# Patient Record
Sex: Female | Born: 1980
Health system: Southern US, Community
[De-identification: ages and names within clinical notes are randomized; demographics above are authoritative.]

## PROBLEM LIST (undated history)

## (undated) DIAGNOSIS — J45909 Unspecified asthma, uncomplicated: Secondary | ICD-10-CM

## (undated) DIAGNOSIS — R87629 Unspecified abnormal cytological findings in specimens from vagina: Secondary | ICD-10-CM

## (undated) HISTORY — DX: Unspecified abnormal cytological findings in specimens from vagina: R87.629

---

## 2002-03-23 ENCOUNTER — Other Ambulatory Visit: Admission: RE | Admit: 2002-03-23 | Discharge: 2002-03-23 | Payer: Self-pay | Admitting: Obstetrics and Gynecology

## 2005-02-09 ENCOUNTER — Ambulatory Visit: Payer: Self-pay | Admitting: Family Medicine

## 2005-06-23 ENCOUNTER — Ambulatory Visit: Payer: Self-pay | Admitting: Sports Medicine

## 2007-06-01 ENCOUNTER — Emergency Department (HOSPITAL_COMMUNITY): Admission: EM | Admit: 2007-06-01 | Discharge: 2007-06-01 | Payer: Self-pay | Admitting: Emergency Medicine

## 2008-12-25 ENCOUNTER — Ambulatory Visit: Payer: Self-pay | Admitting: Sports Medicine

## 2008-12-25 DIAGNOSIS — R269 Unspecified abnormalities of gait and mobility: Secondary | ICD-10-CM | POA: Insufficient documentation

## 2008-12-25 DIAGNOSIS — M2010 Hallux valgus (acquired), unspecified foot: Secondary | ICD-10-CM | POA: Insufficient documentation

## 2008-12-25 DIAGNOSIS — M2140 Flat foot [pes planus] (acquired), unspecified foot: Secondary | ICD-10-CM | POA: Insufficient documentation

## 2009-07-31 ENCOUNTER — Ambulatory Visit: Payer: Self-pay | Admitting: Sports Medicine

## 2009-07-31 DIAGNOSIS — M79609 Pain in unspecified limb: Secondary | ICD-10-CM | POA: Insufficient documentation

## 2010-05-05 NOTE — Assessment & Plan Note (Signed)
Summary: ORTHOTICS/JW   Vital Signs:  Patient profile:   30 year old female Height:      63 inches Weight:      160 pounds BMI:     28.45 BP sitting:   100 / 60  Vitals Entered By: Lillia Pauls CMA (December 25, 2008 2:58 PM)  History of Present Illness: Received orthotics from outside facility 3 yrs ago. Notes excessive thickness of heel portion. Otherwise she is satisfied with her orthotics. Desires new orthotics with decreased heel thickness.  Physical Exam  General:  Well-developed,well-nourished,in no acute distress; alert,appropriate and cooperative throughout examination Msk:  Ankles/Feet: FROM. 5/5 strength. Normal neurovascular examination. Bilateral pes planus with excessive pronation which is corrected after insertion of orthotics which were molded to her desired comfort level. Normal PT function. No callous formation. Mild left hallux valgus.   Impression & Recommendations:  Problem # 1:  ABNORMALITY OF GAIT (ICD-781.2)  The patient was fitted for a standard, cushioned, size 6 orthotic. The orthotic was subsequently heated. The patient was placed on the orthotic blank positioned on the orthotic stand. The patient was positioned in subtalar neutral position in a weight bearing stance. After completion of molding, a stable blue EVA base was applied to the orthotic blank. The blank was ground to a stable position for weight bearing.  She will return to clinic as needed.  time spent 30 mins  Orders: [employer], each unit ([id number])  Problem # 2:  HALLUX VALGUS (ICD-735.0)  -Will monitor for now given asymptomatic state. -RTC as needed.  Orders: [employer], each unit [phone])  Problem # 3:  PES PLANUS (ICD-734)  Orders: [employer] Materials, each unit ([id number])  orthotics provid adequate correction she will need these permanently

## 2010-05-05 NOTE — Assessment & Plan Note (Signed)
Summary: ORTHOTICS ADJUSTMENT   Vital Signs:  Patient profile:   30 year old female Height:      63 inches Weight:      158 pounds BP sitting:   113 / 74  Vitals Entered By: Lillia Pauls CMA (July 31, 2009 1:41 PM)  History of Present Illness: Pt presents with left great to pain that has started since she has started doing more running in addition to a boot camp. She has had a similar pain before and had been diagnosed with sesamoiditis. Her last pair of orthotics had a a special sesamoid cushion that was not placed on her most recent pair of orthotics. She is interested in trying to get that padding placed again on her newest pair of orthotics. She has not needed any medications and it does not wake her from sleep. She is able to continue her normal activities but has noticed that the pain has become more persistent.   Physical Exam  General:  alert and well-developed.   Head:  normocephalic and atraumatic.   Neck:  supple.   Lungs:  normal respiratory effort.   Msk:  Left Foot: Transverse arch breakdown No bony abnormalities, edema or bruising Full ROM of her toes and ankles Pain along the plantar surface with forced big toe extension No significant tenderness to palpation over her medial and lateral sesamoids 5/5 strength with resisted ankle ROM testing Able to bear weight easily  Right Foot: Normal inspection Full ROM of her ankle and toes No TTP throughout 5/5 strength with resisted ankle ROM Able to bear weight easily   Impression & Recommendations:  Problem # 1:  FOOT PAIN (ICD-729.5) Assessment New Appears to have mild tendinitis of flexor hallicus longus vs. mild sesamoiditis 1. Sesamoid "L" cushion on her left orthotic to decrease impact over her flexor tendon. She felt comfortable with this prior to leaving the office. 2. May drop off her new orthotics with her old orthotics (which have the padding which has worked in the past) if the "L" shaped cushion does  not work. Consider a simple first ray pad as well. 3. Return as needed.   Problem # 2:  PES PLANUS (ICD-734)

## 2011-01-06 ENCOUNTER — Other Ambulatory Visit: Payer: Self-pay | Admitting: Sports Medicine

## 2011-01-06 DIAGNOSIS — M25512 Pain in left shoulder: Secondary | ICD-10-CM

## 2011-01-06 DIAGNOSIS — M25511 Pain in right shoulder: Secondary | ICD-10-CM

## 2011-01-10 ENCOUNTER — Other Ambulatory Visit: Payer: Self-pay

## 2011-01-12 ENCOUNTER — Other Ambulatory Visit: Payer: Self-pay

## 2011-02-08 ENCOUNTER — Ambulatory Visit
Admission: RE | Admit: 2011-02-08 | Discharge: 2011-02-08 | Disposition: A | Discharge status: Home / Self Care | Payer: BC Managed Care – PPO | Source: Ambulatory Visit | Attending: Sports Medicine | Admitting: Sports Medicine

## 2011-02-08 DIAGNOSIS — M25512 Pain in left shoulder: Secondary | ICD-10-CM

## 2012-04-05 NOTE — L&D Delivery Note (Signed)
Delivery Note At 4:33 AM a viable and healthy female was delivered via  (Presentation: LOA  ).  APGAR: 9,9 ; weight pending.   Placenta status: Intact, Spontaneous.  Cord:  with the following complications: [place]ane[place] x one reduced on perineum.  Cord pH: na  Anesthesia:  epidural Episiotomy: none Lacerations: second Suture Repair: 2.0 vicryl rapide Est. Blood Loss (mL): 200  Mom to postpartum.  Baby to nursery-stable.  Hades Mathew J 11/20/2012, 4:43 AM

## 2012-04-20 LAB — OB RESULTS CONSOLE HEPATITIS B SURFACE ANTIGEN: Hepatitis B Surface Ag: NEGATIVE

## 2012-04-20 LAB — OB RESULTS CONSOLE RUBELLA ANTIBODY, IGM: Rubella: IMMUNE

## 2012-04-20 LAB — OB RESULTS CONSOLE ABO/RH: RH Type: POSITIVE

## 2012-04-20 LAB — OB RESULTS CONSOLE HIV ANTIBODY (ROUTINE TESTING): HIV: NONREACTIVE

## 2012-04-20 LAB — OB RESULTS CONSOLE ANTIBODY SCREEN: Antibody Screen: NEGATIVE

## 2012-04-20 LAB — OB RESULTS CONSOLE RPR: RPR: NONREACTIVE

## 2012-05-01 LAB — OB RESULTS CONSOLE GC/CHLAMYDIA
Chlamydia: NEGATIVE
Gonorrhea: NEGATIVE

## 2012-10-12 LAB — OB RESULTS CONSOLE GBS: GBS: NEGATIVE

## 2012-11-19 ENCOUNTER — Inpatient Hospital Stay (HOSPITAL_COMMUNITY)
Admission: AD | Admit: 2012-11-19 | Discharge: 2012-11-21 | DRG: 373 | Disposition: A | Discharge status: Home / Self Care | Payer: BC Managed Care – PPO | Source: Ambulatory Visit | Attending: Obstetrics and Gynecology | Admitting: Obstetrics and Gynecology

## 2012-11-19 ENCOUNTER — Encounter (HOSPITAL_COMMUNITY): Payer: Self-pay

## 2012-11-19 DIAGNOSIS — M2010 Hallux valgus (acquired), unspecified foot: Secondary | ICD-10-CM

## 2012-11-19 DIAGNOSIS — M2140 Flat foot [pes planus] (acquired), unspecified foot: Secondary | ICD-10-CM

## 2012-11-19 DIAGNOSIS — M79609 Pain in unspecified limb: Secondary | ICD-10-CM

## 2012-11-19 DIAGNOSIS — R269 Unspecified abnormalities of gait and mobility: Secondary | ICD-10-CM

## 2012-11-19 HISTORY — DX: Unspecified asthma, uncomplicated: J45.909

## 2012-11-19 LAB — POCT FERN TEST: POCT Fern Test: POSITIVE

## 2012-11-19 MED ORDER — LACTATED RINGERS IV SOLN
INTRAVENOUS | Status: DC
  Administered 2012-11-19: 125 mL/h via INTRAVENOUS
  Administered 2012-11-20: 02:00:00 via INTRAVENOUS

## 2012-11-19 MED ORDER — IBUPROFEN 600 MG PO TABS: 600.0000 mg | ORAL_TABLET | Freq: Four times a day (QID) | ORAL | Status: DC | PRN

## 2012-11-19 MED ORDER — OXYTOCIN BOLUS FROM INFUSION
500.0000 mL | INTRAVENOUS | Status: DC
  Administered 2012-11-20: 500 mL via INTRAVENOUS

## 2012-11-19 MED ORDER — ONDANSETRON HCL 4 MG/2ML IJ SOLN: 4.0000 mg | Freq: Four times a day (QID) | INTRAMUSCULAR | Status: DC | PRN

## 2012-11-19 MED ORDER — OXYTOCIN 40 UNITS IN LACTATED RINGERS INFUSION - SIMPLE MED
62.5000 mL/h | INTRAVENOUS | Status: DC
  Administered 2012-11-20: 62.5 mL/h via INTRAVENOUS

## 2012-11-19 MED ORDER — LIDOCAINE HCL (PF) 1 % IJ SOLN
30.0000 mL | INTRAMUSCULAR | Status: DC | PRN
  Filled 2012-11-19 (×2): qty 30

## 2012-11-19 MED ORDER — FLEET ENEMA 7-19 GM/118ML RE ENEM: 1.0000 | ENEMA | RECTAL | Status: DC | PRN

## 2012-11-19 MED ORDER — CITRIC ACID-SODIUM CITRATE 334-500 MG/5ML PO SOLN: 30.0000 mL | ORAL | Status: DC | PRN

## 2012-11-19 MED ORDER — TERBUTALINE SULFATE 1 MG/ML IJ SOLN: 0.2500 mg | Freq: Once | INTRAMUSCULAR | Status: AC | PRN

## 2012-11-19 MED ORDER — OXYTOCIN 40 UNITS IN LACTATED RINGERS INFUSION - SIMPLE MED
1.0000 m[IU]/min | INTRAVENOUS | Status: DC
  Administered 2012-11-20: 2 m[IU]/min via INTRAVENOUS

## 2012-11-19 MED ORDER — OXYCODONE-ACETAMINOPHEN 5-325 MG PO TABS: 1.0000 | ORAL_TABLET | ORAL | Status: DC | PRN

## 2012-11-19 MED ORDER — ACETAMINOPHEN 325 MG PO TABS: 650.0000 mg | ORAL_TABLET | ORAL | Status: DC | PRN

## 2012-11-19 MED ORDER — LACTATED RINGERS IV SOLN: 500.0000 mL | INTRAVENOUS | Status: DC | PRN

## 2012-11-19 NOTE — MAU Note (Signed)
Pt states uc q 3-4 mins for several hours. States some LOF but not sure if water is broken. Denies vaginal bleeding. States good FM

## 2012-11-19 NOTE — Progress Notes (Signed)
Laura Bruce is a 32 y.o. G1P0 at [redacted]w[redacted]d by LMP admitted for active labor, rupture of membranes  Subjective: Chief Complaint  Patient presents with  . Rupture of Membranes    Objective: BP 111/81  Pulse 90  Temp(Src) 97.5 F (36.4 C) (Oral)  Resp 18  SpO2 99%      FHT:  FHR: 145 bpm, variability: moderate,  accelerations:  Present,  decelerations:  Absent UC:   irregular, every 4 minutes SVE:   1 cm dilated, 70 effaced, -1 station Clear AF Labs: No results found for this basename: WBC, HGB, HCT, MCV, PLT    Assessment / Plan: Spontaneous labor, progressing normally  Labor: Progressing normally Fetal Wellbeing:  Category I Pain Control:  Labor support without medications Anticipated MOD:  NSVD  Laura Bruce 11/19/2012, 11:36 PM

## 2012-11-19 NOTE — MAU Provider Note (Signed)
Laura Bruce is a 32 y.o. female presenting for ROM and labor. History OB History   Grav Para Term Preterm Abortions TAB SAB Ect Mult Living   1              History reviewed. No pertinent past medical history. History reviewed. No pertinent past surgical history. Family History: family history is not on file. Social History:  reports that she has never smoked. She does not have any smokeless tobacco history on file. She reports that she does not drink alcohol or use illicit drugs.   Prenatal Transfer Tool  Maternal Diabetes: No Genetic Screening: Normal Maternal Ultrasounds/Referrals: Normal Fetal Ultrasounds or other Referrals:  None Maternal Substance Abuse:  No Significant Maternal Medications:  None Significant Maternal Lab Results:  None Other Comments:  None  ROS  Dilation: 1 Effacement (%): 70 Station: -1 Exam by:: D Simpson RN Blood pressure 111/81, pulse 90, temperature 97.5 F (36.4 C), temperature source Oral, resp. rate 18, SpO2 99.00%. Exam Physical Exam  HEENT: nl Neck supple Lungs clear Cardio: RRR Abd : gravid , nt Ve above Neuro nonfocal  Prenatal labs:- see prenata ABO, Rh:   Antibody:   Rubella:   RPR:    HBsAg:    HIV:    GBS:     Assessment/Plan: Term IUP with SROM and early labor Pitocin , see orders   Lorel Lembo J 11/19/2012, 11:37 PM

## 2012-11-20 ENCOUNTER — Inpatient Hospital Stay (HOSPITAL_COMMUNITY): Payer: BC Managed Care – PPO | Admitting: Anesthesiology

## 2012-11-20 ENCOUNTER — Encounter (HOSPITAL_COMMUNITY): Payer: Self-pay | Admitting: *Deleted

## 2012-11-20 ENCOUNTER — Encounter (HOSPITAL_COMMUNITY): Payer: Self-pay | Admitting: Anesthesiology

## 2012-11-20 LAB — CBC
HCT: 33 % — ABNORMAL LOW (ref 36.0–46.0)
HCT: 36 % (ref 36.0–46.0)
Hemoglobin: 11.4 g/dL — ABNORMAL LOW (ref 12.0–15.0)
Hemoglobin: 12.5 g/dL (ref 12.0–15.0)
MCH: 29.6 pg (ref 26.0–34.0)
MCH: 29.6 pg (ref 26.0–34.0)
MCHC: 34.5 g/dL (ref 30.0–36.0)
MCHC: 34.7 g/dL (ref 30.0–36.0)
MCV: 85.3 fL (ref 78.0–100.0)
MCV: 85.7 fL (ref 78.0–100.0)
Platelets: 195 10*3/uL (ref 150–400)
Platelets: 229 10*3/uL (ref 150–400)
RBC: 3.85 MIL/uL — ABNORMAL LOW (ref 3.87–5.11)
RBC: 4.22 MIL/uL (ref 3.87–5.11)
RDW: 13.9 % (ref 11.5–15.5)
RDW: 13.9 % (ref 11.5–15.5)
WBC: 14 10*3/uL — ABNORMAL HIGH (ref 4.0–10.5)
WBC: 17.9 10*3/uL — ABNORMAL HIGH (ref 4.0–10.5)

## 2012-11-20 LAB — RPR: RPR Ser Ql: NONREACTIVE

## 2012-11-20 MED ORDER — PRENATAL MULTIVITAMIN CH
1.0000 | ORAL_TABLET | Freq: Every day | ORAL | Status: DC
  Administered 2012-11-20 – 2012-11-21 (×2): 1 via ORAL
  Filled 2012-11-20 (×2): qty 1

## 2012-11-20 MED ORDER — BENZOCAINE-MENTHOL 20-0.5 % EX AERO
1.0000 "application " | INHALATION_SPRAY | CUTANEOUS | Status: DC | PRN
  Administered 2012-11-20: 1 via TOPICAL
  Filled 2012-11-20: qty 56

## 2012-11-20 MED ORDER — SENNOSIDES-DOCUSATE SODIUM 8.6-50 MG PO TABS
2.0000 | ORAL_TABLET | Freq: Every day | ORAL | Status: DC
  Administered 2012-11-20: 2 via ORAL

## 2012-11-20 MED ORDER — METHYLERGONOVINE MALEATE 0.2 MG PO TABS: 0.2000 mg | ORAL_TABLET | ORAL | Status: DC | PRN

## 2012-11-20 MED ORDER — ONDANSETRON HCL 4 MG PO TABS: 4.0000 mg | ORAL_TABLET | ORAL | Status: DC | PRN

## 2012-11-20 MED ORDER — METHYLERGONOVINE MALEATE 0.2 MG/ML IJ SOLN: 0.2000 mg | INTRAMUSCULAR | Status: DC | PRN

## 2012-11-20 MED ORDER — LACTATED RINGERS IV SOLN
500.0000 mL | Freq: Once | INTRAVENOUS | Status: AC
  Administered 2012-11-20: 500 mL via INTRAVENOUS

## 2012-11-20 MED ORDER — FENTANYL 2.5 MCG/ML BUPIVACAINE 1/10 % EPIDURAL INFUSION (WH - ANES)
14.0000 mL/h | INTRAMUSCULAR | Status: DC | PRN
  Filled 2012-11-20: qty 125

## 2012-11-20 MED ORDER — WITCH HAZEL-GLYCERIN EX PADS: 1.0000 "application " | MEDICATED_PAD | CUTANEOUS | Status: DC | PRN

## 2012-11-20 MED ORDER — EPHEDRINE 5 MG/ML INJ
10.0000 mg | INTRAVENOUS | Status: DC | PRN
  Filled 2012-11-20: qty 2
  Filled 2012-11-20: qty 4

## 2012-11-20 MED ORDER — DIPHENHYDRAMINE HCL 50 MG/ML IJ SOLN: 12.5000 mg | INTRAMUSCULAR | Status: DC | PRN

## 2012-11-20 MED ORDER — ONDANSETRON HCL 4 MG/2ML IJ SOLN: 4.0000 mg | INTRAMUSCULAR | Status: DC | PRN

## 2012-11-20 MED ORDER — PHENYLEPHRINE 40 MCG/ML (10ML) SYRINGE FOR IV PUSH (FOR BLOOD PRESSURE SUPPORT)
80.0000 ug | PREFILLED_SYRINGE | INTRAVENOUS | Status: DC | PRN
  Filled 2012-11-20: qty 5
  Filled 2012-11-20: qty 2

## 2012-11-20 MED ORDER — DIBUCAINE 1 % RE OINT: 1.0000 "application " | TOPICAL_OINTMENT | RECTAL | Status: DC | PRN

## 2012-11-20 MED ORDER — LANOLIN HYDROUS EX OINT: TOPICAL_OINTMENT | CUTANEOUS | Status: DC | PRN

## 2012-11-20 MED ORDER — TETANUS-DIPHTH-ACELL PERTUSSIS 5-2.5-18.5 LF-MCG/0.5 IM SUSP: 0.5000 mL | Freq: Once | INTRAMUSCULAR | Status: DC

## 2012-11-20 MED ORDER — ZOLPIDEM TARTRATE 5 MG PO TABS: 5.0000 mg | ORAL_TABLET | Freq: Every evening | ORAL | Status: DC | PRN

## 2012-11-20 MED ORDER — EPHEDRINE 5 MG/ML INJ
10.0000 mg | INTRAVENOUS | Status: DC | PRN
  Filled 2012-11-20: qty 2

## 2012-11-20 MED ORDER — MONTELUKAST SODIUM 10 MG PO TABS
10.0000 mg | ORAL_TABLET | Freq: Every day | ORAL | Status: DC
  Administered 2012-11-20: 10 mg via ORAL
  Filled 2012-11-20 (×2): qty 1

## 2012-11-20 MED ORDER — OXYCODONE-ACETAMINOPHEN 5-325 MG PO TABS: 1.0000 | ORAL_TABLET | ORAL | Status: DC | PRN

## 2012-11-20 MED ORDER — DIPHENHYDRAMINE HCL 25 MG PO CAPS: 25.0000 mg | ORAL_CAPSULE | Freq: Four times a day (QID) | ORAL | Status: DC | PRN

## 2012-11-20 MED ORDER — SIMETHICONE 80 MG PO CHEW: 80.0000 mg | CHEWABLE_TABLET | ORAL | Status: DC | PRN

## 2012-11-20 MED ORDER — PHENYLEPHRINE 40 MCG/ML (10ML) SYRINGE FOR IV PUSH (FOR BLOOD PRESSURE SUPPORT)
80.0000 ug | PREFILLED_SYRINGE | INTRAVENOUS | Status: DC | PRN
  Filled 2012-11-20: qty 2

## 2012-11-20 MED ORDER — IBUPROFEN 600 MG PO TABS
600.0000 mg | ORAL_TABLET | Freq: Four times a day (QID) | ORAL | Status: DC
  Administered 2012-11-20 – 2012-11-21 (×6): 600 mg via ORAL
  Filled 2012-11-20 (×6): qty 1

## 2012-11-20 NOTE — Anesthesia Procedure Notes (Signed)
Epidural Patient location during procedure: OB Start time: 11/20/2012 12:20 AM End time: 11/20/2012 12:40 AM  Staffing Anesthesiologist: Lewie Loron R Performed by: anesthesiologist   Preanesthetic Checklist Completed: patient identified, pre-op evaluation, timeout performed, IV checked, risks and benefits discussed and monitors and equipment checked  Epidural Patient position: sitting Prep: site prepped and draped and DuraPrep Patient monitoring: heart rate, continuous pulse ox and blood pressure Approach: midline Injection technique: LOR air and LOR saline  Needle:  Needle type: Tuohy  Needle gauge: 17 G Needle length: 9 cm Needle insertion depth: 7 cm Catheter type: closed end flexible Catheter size: 19 Gauge Catheter at skin depth: 13 cm Test dose: negative  Assessment Sensory level: T8 Events: blood not aspirated, injection not painful, no injection resistance, negative IV test and no paresthesia  Additional Notes First attempt at L2/3, catheter threaded easily but intravascular. Second attmept without issue.Reason for block:procedure for pain

## 2012-11-20 NOTE — Anesthesia Preprocedure Evaluation (Signed)
Anesthesia Evaluation  Patient identified by MRN, date of birth, ID band Patient awake    Reviewed: Allergy & Precautions, H&P , NPO status , Patient's Chart, lab work & pertinent test results  Airway Mallampati: II TM Distance: >3 FB Neck ROM: Full    Dental  (+) Teeth Intact   Pulmonary asthma ,  breath sounds clear to auscultation        Cardiovascular negative cardio ROS  Rhythm:Regular Rate:Normal     Neuro/Psych negative neurological ROS  negative psych ROS   GI/Hepatic negative GI ROS, Neg liver ROS,   Endo/Other  negative endocrine ROS  Renal/GU negative Renal ROS     Musculoskeletal negative musculoskeletal ROS (+)   Abdominal   Peds  Hematology negative hematology ROS (+)   Anesthesia Other Findings   Reproductive/Obstetrics (+) Pregnancy                           Anesthesia Physical Anesthesia Plan  ASA: II  Anesthesia Plan: Epidural   Post-op Pain Management:    Induction:   Airway Management Planned:   Additional Equipment:   Intra-op Plan:   Post-operative Plan:   Informed Consent: I have reviewed the patients History and Physical, chart, labs and discussed the procedure including the risks, benefits and alternatives for the proposed anesthesia with the patient or authorized representative who has indicated his/her understanding and acceptance.     Plan Discussed with:   Anesthesia Plan Comments:         Anesthesia Quick Evaluation

## 2012-11-20 NOTE — Anesthesia Postprocedure Evaluation (Signed)
  Anesthesia Post-op Note  Patient: Laura Bruce  Procedure(s) Performed: * No procedures listed *  Patient Location: Mother/Baby  Anesthesia Type:Epidural  Level of Consciousness: awake  Airway and Oxygen Therapy: Patient Spontanous Breathing  Post-op Pain: none  Post-op Assessment: Patient's Cardiovascular Status Stable, Respiratory Function Stable, Patent Airway, No signs of Nausea or vomiting, Adequate PO intake, Pain level controlled, No headache, No backache, No residual numbness and No residual motor weakness  Post-op Vital Signs: Reviewed and stable  Complications: No apparent anesthesia complications

## 2012-11-21 MED ORDER — IBUPROFEN 600 MG PO TABS: 600.0000 mg | ORAL_TABLET | Freq: Four times a day (QID) | ORAL | Status: DC | PRN

## 2012-11-21 MED ORDER — DOCUSATE SODIUM 100 MG PO CAPS: 100.0000 mg | ORAL_CAPSULE | Freq: Two times a day (BID) | ORAL | Status: DC | PRN

## 2012-11-21 MED ORDER — OXYCODONE-ACETAMINOPHEN 5-325 MG PO TABS: 1.0000 | ORAL_TABLET | Freq: Four times a day (QID) | ORAL | Status: DC | PRN

## 2012-11-21 NOTE — Progress Notes (Signed)
Patient ID: Laura Bruce, female   DOB: July 24, 1980, 32 y.o.   MRN: [id number] PPD # 1  Subjective: Pt reports feeling well and eager for early d/c home/ Pain controlled with ibuprofen Tolerating po/ Voiding without problems/ No n/v Bleeding is light/ Newborn info:  Information for the patient's newborn:  Carmela, Piechowski [[id number]]  female Feeding: breast    Objective:  VS: Blood pressure 98/71, pulse 74, temperature 97.9 F (36.6 C), temperature source Oral, resp. rate 17.    Recent Labs  11/19/12 2344 11/20/12 0840  WBC 14.0* 17.9*  HGB 12.5 11.4*  HCT 36.0 33.0*  PLT 229 195    Blood type: B/Positive/-- (01/16 0000) Rubella: Immune (01/16 0000)    Physical Exam:  General:  alert, cooperative and no distress CV: Regular rate and rhythm Resp: clear Abdomen: soft, nontender, normal bowel sounds Uterine Fundus: firm, below umbilicus, nontender Perineum: healing with good reapproximation Lochia: minimal Ext: edema trace and Homans sign is negative, no sign of DVT    A/P: PPD # 2/ G1P1001/ S/P: SVD with 2nd deg repair Doing well and stable for discharge home RX: Ibuprofen 600mg  po Q 6 hrs prn pain #30 Refill x 1 Colace 100mg  po BID prn #60 ref x 1 Percocet 5/325 1 to 2 po Q 4 hrs prn pain #15 No refill WOB/GYN booklet given Routine pp visit in 6wks   Demetrius Revel, MSN, [facility] 11/21/2012, 10:25 AM

## 2012-11-21 NOTE — Discharge Summary (Signed)
Obstetric Discharge Summary Reason for Admission: G1 P0 @ 39w 4d with onset active labor and ROM Prenatal Procedures: NST and ultrasound Intrapartum Procedures: spontaneous vaginal delivery Postpartum Procedures: none Complications-Operative and Postpartum: 2nd degree perineal laceration Hemoglobin  Date Value Range Status  11/20/2012 11.4* 12.0 - 15.0 g/dL Final     HCT  Date Value Range Status  11/20/2012 33.0* 36.0 - 46.0 % Final    Physical Exam:  General: alert, cooperative and no distress Lochia: appropriate Uterine Fundus: firm Incision: n/a DVT Evaluation: No evidence of DVT seen on physical exam. Negative Homan's sign.  Discharge Diagnoses: G1 P1 s/p SVD with 2nd deg repair at 39wks  Discharge Information: Date: 11/21/2012 Activity: pelvic rest Diet: routine Medications: PNV, Ibuprofen, Colace and Percocet Condition: stable Instructions: refer to practice specific booklet Discharge to: home Follow-up Information   Follow up with Lenoard Aden, MD In 6 weeks.   Specialty:  Obstetrics and Gynecology   Contact information:   [address] [place] [address] [phone]       Newborn Data: Live born female on 11/20/12 Birth Weight: 7 lb 0.2 oz (3180 g) APGAR: 9, 9  Home with mother.  Detra Bores K 11/21/2012, 10:27 AM

## 2012-11-27 MED ORDER — FENTANYL 2.5 MCG/ML BUPIVACAINE 1/10 % EPIDURAL INFUSION (WH - ANES)
INTRAMUSCULAR | Status: DC | PRN
  Administered 2012-11-20: 14 mL/h via EPIDURAL

## 2013-02-27 ENCOUNTER — Encounter (INDEPENDENT_AMBULATORY_CARE_PROVIDER_SITE_OTHER): Payer: Self-pay | Admitting: Surgery

## 2013-02-27 ENCOUNTER — Ambulatory Visit (INDEPENDENT_AMBULATORY_CARE_PROVIDER_SITE_OTHER): Payer: BC Managed Care – PPO | Admitting: Surgery

## 2013-02-27 VITALS — BP 122/62 | HR 67 | Temp 98.0°F | Resp 18 | Ht 63.0 in | Wt 156.0 lb

## 2013-02-27 DIAGNOSIS — K8010 Calculus of gallbladder with chronic cholecystitis without obstruction: Secondary | ICD-10-CM

## 2013-02-27 NOTE — Patient Instructions (Signed)
  [facility]  LAPAROSCOPIC SURGERY - POST-OP INSTRUCTIONS  Always review your discharge instruction sheet given to you by the facility where your surgery was performed.  A prescription for pain medication may be given to you upon discharge.  Take your pain medication as prescribed.  If narcotic pain medicine is not needed, then you may take acetaminophen (Tylenol) or ibuprofen (Advil) as needed.  Take your usually prescribed medications unless otherwise directed.  If you need a refill on your pain medication, please contact your pharmacy.  They will contact our office to request authorization. Prescriptions will not be filled after 5 P.M. or on weekends.  You should follow a light diet the first few days after arrival home, such as soup and crackers or toast.  Be sure to include plenty of fluids daily.  Most patients will experience some swelling and bruising in the area of the incisions.  Ice packs will help.  Swelling and bruising can take several days to resolve.   It is common to experience some constipation if taking pain medication after surgery.  Increasing fluid intake and taking a stool softener (such as Colace) will usually help or prevent this problem from occurring.  A mild laxative (Milk of Magnesia or Miralax) should be taken according to package instructions if there are no bowel movements after 48 hours.  Unless discharge instructions indicate otherwise, you may remove your bandages 24-48 hours after surgery, and you may shower at that time.  You may have steri-strips (small skin tapes) in place directly over the incision.  These strips should be left on the skin for 7-10 days.  If your surgeon used skin glue on the incision, you may shower in 24 hours.  The glue will flake off over the next 2-3 weeks.  Any sutures or staples will be removed at the office during your follow-up visit.  ACTIVITIES:  You may resume regular (light) daily activities beginning the  next day-such as daily self-care, walking, climbing stairs-gradually increasing activities as tolerated.  You may have sexual intercourse when it is comfortable.  Refrain from any heavy lifting or straining until approved by your doctor.  You may drive when you are no longer taking prescription pain medication, you can comfortably wear a seatbelt, and you can safely maneuver your car and apply brakes.  You should see your doctor in the office for a follow-up appointment approximately 2-3 weeks after your surgery.  Make sure that you call for this appointment within a day or two after you arrive home to insure a convenient appointment time.  WHEN TO CALL YOUR DOCTOR: 1. Fever over 101.0 2. Inability to urinate 3. Continued bleeding from incision 4. Increased pain, redness, or drainage from the incision 5. Increasing abdominal pain  The clinic staff is available to answer your questions during regular business hours.  Please don't hesitate to call and ask to speak to one of the nurses for clinical concerns.  If you have a medical emergency, go to the nearest emergency room or call 911.  A surgeon from [facility] is always on call for the hospital.  Ayani Ospina M. Dayane Hillenburg, MD, FACS [facility] Office: [phone] Toll Free:  [phone] FAX [phone]  Web site: [email or web address] 

## 2013-02-27 NOTE — Progress Notes (Signed)
[facility]  Chief Complaint  Patient presents with  . New Evaluation    gallstones - referral from Dr. Elaine Griffin    HISTORY: Patient is a 32-year-old female referred by her primary care physician for management of symptomatic cholelithiasis. Patient has had a several month history of intermittent right upper quadrant and epigastric abdominal pain. In the past she has noted fatty food intolerance. She denies any history of jaundice or acholic stools. She denies fevers or chills. She has experienced nausea but no emesis. Patient does note that her mother underwent cholecystectomy for gallstones.  Ultrasound was performed. This showed multiple gallstones. There were no acute inflammatory changes. There was no biliary dilatation. Liver was normal.  Past Medical History  Diagnosis Date  . Asthma   . Postpartum care following vaginal delivery (8/18) 11/20/2012    Current Outpatient Prescriptions  Medication Sig Dispense Refill  . albuterol (PROVENTIL) (2.5 MG/3ML) 0.083% nebulizer solution Take 2.5 mg by nebulization every 6 (six) hours as needed for wheezing.      . cetirizine (ZYRTEC) 10 MG tablet Take 10 mg by mouth daily.      . DEBLITANE 0.35 MG tablet       . montelukast (SINGULAIR) 10 MG tablet Take 10 mg by mouth at bedtime.      . Prenatal Vit-Fe Fumarate-FA (MULTIVITAMIN-PRENATAL) 27-0.8 MG TABS tablet Take 1 tablet by mouth daily at 12 noon.      . therapeutic multivitamin-minerals (THERAGRAN-M) tablet Take 1 tablet by mouth daily.      . docusate sodium (COLACE) 100 MG capsule Take 1 capsule (100 mg total) by mouth 2 (two) times daily as needed for constipation.  60 capsule  1  . ibuprofen (ADVIL,MOTRIN) 600 MG tablet Take 1 tablet (600 mg total) by mouth every 6 (six) hours as needed for pain.  30 tablet  1  . oxyCODONE-acetaminophen (PERCOCET/ROXICET) 5-325 MG per tablet Take 1-2 tablets by mouth every 6 (six) hours as needed.  12 tablet   0   No current facility-administered medications for this visit.    No Known Allergies  Family History  Problem Relation Age of Onset  . Cancer Maternal Grandmother     breast    History   Social History  . Marital Status: Married    Spouse Name: N/A    Number of Children: N/A  . Years of Education: N/A   Social History Main Topics  . Smoking status: Never Smoker   . Smokeless tobacco: None  . Alcohol Use: No  . Drug Use: No  . Sexual Activity: Yes    Birth Control/ Protection: None   Other Topics Concern  . None   Social History Narrative  . None    REVIEW OF SYSTEMS - PERTINENT POSITIVES ONLY: Intermittent epigastric and right upper quadrant abdominal pain. Denies jaundice. Denies acholic stools. Denies fever. Denies chills. Occasional nausea. History of fatty food intolerance.  EXAM: Filed Vitals:   02/27/13 1640  BP: 122/62  Pulse: 67  Temp: 98 F (36.7 C)  Resp: 18    HEENT: normocephalic; pupils equal and reactive; sclerae clear; dentition good; mucous membranes moist NECK:  symmetric on extension; no palpable anterior or posterior cervical lymphadenopathy; no supraclavicular masses; no tenderness CHEST: clear to auscultation bilaterally without rales, rhonchi, or wheezes CARDIAC: regular rate and rhythm without significant murmur; peripheral pulses are full ABDOMEN: soft without distension; bowel sounds present; no mass; no hepatosplenomegaly; no hernia EXT:  non-tender   without edema; no deformity NEURO: no gross focal deficits; no sign of tremor   LABORATORY RESULTS: See [facility] (CHL-Epic) for most recent results  RADIOLOGY RESULTS: See [facility] (CHL-Epic) for most recent results  IMPRESSION: Symptomatic cholelithiasis, probable chronic cholecystitis  PLAN: Patient and I had a lengthy discussion regarding cholecystectomy. I provided her with written literature to review at home. We discussed laparoscopic cholecystectomy with  intraoperative cholangiography. We discussed the risk and benefits of the procedure. We discussed the potential for conversion to open Bruce. We discussed the hospital stay to be anticipated. We discussed the postoperative recovery and return to work and activities. She understands and wishes to proceed.  We will need to be conscious of the fact that she is 34 months postpartum and is breast feeding. We will bring this to the attention of the anesthesiologist.  The risks and benefits of the procedure have been discussed at length with the patient.  The patient understands the proposed procedure, potential alternative treatments, and the course of recovery to be expected.  All of the patient's questions have been answered at this time.  The patient wishes to proceed with Bruce.  Levia Waltermire M. Jacobe Study, MD, FACS [facility]  Primary Care Physician: GRIFFIN,ELAINE COLLINS, MD   

## 2013-03-09 ENCOUNTER — Encounter (HOSPITAL_COMMUNITY): Admission: RE | Payer: Self-pay | Source: Ambulatory Visit

## 2013-03-09 ENCOUNTER — Ambulatory Visit (HOSPITAL_COMMUNITY): Admission: RE | Admit: 2013-03-09 | Payer: BC Managed Care – PPO | Source: Ambulatory Visit | Admitting: Surgery

## 2013-03-09 SURGERY — LAPAROSCOPIC CHOLECYSTECTOMY WITH INTRAOPERATIVE CHOLANGIOGRAM
Anesthesia: General

## 2013-03-13 ENCOUNTER — Encounter (INDEPENDENT_AMBULATORY_CARE_PROVIDER_SITE_OTHER): Payer: Self-pay

## 2013-03-13 ENCOUNTER — Encounter (HOSPITAL_COMMUNITY): Payer: Self-pay | Admitting: Respiratory Therapy

## 2013-03-13 NOTE — Pre-Procedure Instructions (Signed)
CHARLOTTE FIDALGO  03/13/2013   Your procedure is scheduled on: Friday, December 12th   Report to [facility]  2 * 3 at 9:00 AM.             [facility] Parking is available)  Call this number if you have problems the morning of surgery: [phone]   Remember:   Do not eat food or drink liquids after midnight Thursday.   Take these medicines the morning of surgery with A SIP OF WATER: Cetirizine, Flonase.  Use Inhaler that morning.   Do not wear jewelry, make-up or nail polish.  Do not wear lotions, powders, or perfumes. You may NOT wear deodorant.  Do not shave underarms & legs 48 hours prior to surgery.    Do not bring valuables to the hospital.  [facility] is not responsible for any belongings or valuables.               Contacts, dentures or bridgework may not be worn into surgery.  Leave suitcase in the car. After surgery it may be brought to your room.  For patients admitted to the hospital, discharge time is determined by your treatment team.               Patients discharged the day of surgery will not be allowed to drive home, and a responsible person will need to stay with you for the first 24 hrs.   Name and phone number of your driver:    Special Instructions: Shower using CHG 2 nights before surgery and the night before surgery.  If you shower the day of surgery use CHG.  Use special wash - you have one bottle of CHG for all showers.  You should use approximately 1/3 of the bottle for each shower.   Please read over the following fact sheets that you were given: Pain Booklet and Surgical Site Infection Prevention

## 2013-03-14 ENCOUNTER — Encounter (HOSPITAL_COMMUNITY): Payer: Self-pay

## 2013-03-14 ENCOUNTER — Encounter (HOSPITAL_COMMUNITY)
Admission: RE | Admit: 2013-03-14 | Discharge: 2013-03-14 | Disposition: A | Discharge status: Home / Self Care | Payer: BC Managed Care – PPO | Source: Ambulatory Visit | Attending: Surgery | Admitting: Surgery

## 2013-03-14 LAB — CBC
HCT: 42.2 % (ref 36.0–46.0)
Hemoglobin: 14.5 g/dL (ref 12.0–15.0)
MCH: 30.5 pg (ref 26.0–34.0)
MCHC: 34.4 g/dL (ref 30.0–36.0)
MCV: 88.8 fL (ref 78.0–100.0)
Platelets: 301 10*3/uL (ref 150–400)
RBC: 4.75 MIL/uL (ref 3.87–5.11)
RDW: 12.7 % (ref 11.5–15.5)
WBC: 7.7 10*3/uL (ref 4.0–10.5)

## 2013-03-14 LAB — HCG, SERUM, QUALITATIVE: Preg, Serum: NEGATIVE

## 2013-03-15 ENCOUNTER — Telehealth (INDEPENDENT_AMBULATORY_CARE_PROVIDER_SITE_OTHER): Payer: Self-pay | Admitting: *Deleted

## 2013-03-15 MED ORDER — CEFAZOLIN SODIUM-DEXTROSE 2-3 GM-% IV SOLR
2.0000 g | INTRAVENOUS | Status: AC
  Administered 2013-03-16: 2 g via INTRAVENOUS
  Filled 2013-03-15: qty 50

## 2013-03-15 NOTE — Telephone Encounter (Signed)
Patient called to report that she was up all night last night with vomiting and diarrhea.  Patient is scheduled for lap chole tomorrow morning.  Patient is unsure if this is due to gallbladder or something else.  Explained that I would send a message to Dr. Gerrit Friends to make him aware and see if this is an issue for surgery tomorrow.

## 2013-03-15 NOTE — Telephone Encounter (Signed)
Pt called stating she is feeling much better and will show up for surgery in the morning. I advised pt this msg will be sent to Dr Gerrit Friends as an update.

## 2013-03-16 ENCOUNTER — Ambulatory Visit (HOSPITAL_COMMUNITY): Payer: BC Managed Care – PPO | Admitting: Certified Registered"

## 2013-03-16 ENCOUNTER — Encounter (HOSPITAL_COMMUNITY): Payer: BC Managed Care – PPO | Admitting: Certified Registered"

## 2013-03-16 ENCOUNTER — Encounter (HOSPITAL_COMMUNITY)
Admission: RE | Disposition: A | Discharge status: Home / Self Care | Payer: Self-pay | Source: Ambulatory Visit | Attending: Surgery

## 2013-03-16 ENCOUNTER — Observation Stay (HOSPITAL_COMMUNITY)
Admission: RE | Admit: 2013-03-16 | Discharge: 2013-03-16 | Disposition: A | Discharge status: Home / Self Care | Payer: BC Managed Care – PPO | Source: Ambulatory Visit | Attending: Surgery | Admitting: Surgery

## 2013-03-16 ENCOUNTER — Encounter (HOSPITAL_COMMUNITY): Payer: Self-pay | Admitting: *Deleted

## 2013-03-16 ENCOUNTER — Ambulatory Visit (HOSPITAL_COMMUNITY): Payer: BC Managed Care – PPO

## 2013-03-16 DIAGNOSIS — K8010 Calculus of gallbladder with chronic cholecystitis without obstruction: Secondary | ICD-10-CM

## 2013-03-16 DIAGNOSIS — K8020 Calculus of gallbladder without cholecystitis without obstruction: Secondary | ICD-10-CM | POA: Diagnosis present

## 2013-03-16 DIAGNOSIS — Z01812 Encounter for preprocedural laboratory examination: Secondary | ICD-10-CM | POA: Insufficient documentation

## 2013-03-16 DIAGNOSIS — K824 Cholesterolosis of gallbladder: Secondary | ICD-10-CM | POA: Insufficient documentation

## 2013-03-16 HISTORY — PX: CHOLECYSTECTOMY: SHX55

## 2013-03-16 SURGERY — LAPAROSCOPIC CHOLECYSTECTOMY WITH INTRAOPERATIVE CHOLANGIOGRAM
Anesthesia: General

## 2013-03-16 MED ORDER — PHENYLEPHRINE HCL 10 MG/ML IJ SOLN
INTRAMUSCULAR | Status: DC | PRN
  Administered 2013-03-16: 80 ug via INTRAVENOUS

## 2013-03-16 MED ORDER — PROPOFOL 10 MG/ML IV BOLUS
INTRAVENOUS | Status: DC | PRN
  Administered 2013-03-16: 50 mg via INTRAVENOUS

## 2013-03-16 MED ORDER — ALBUTEROL SULFATE (5 MG/ML) 0.5% IN NEBU: 2.5000 mg | INHALATION_SOLUTION | Freq: Four times a day (QID) | RESPIRATORY_TRACT | Status: DC | PRN

## 2013-03-16 MED ORDER — ONDANSETRON HCL 4 MG/2ML IJ SOLN
4.0000 mg | Freq: Four times a day (QID) | INTRAMUSCULAR | Status: AC | PRN
  Administered 2013-03-16: 4 mg via INTRAVENOUS

## 2013-03-16 MED ORDER — 0.9 % SODIUM CHLORIDE (POUR BTL) OPTIME
TOPICAL | Status: DC | PRN
  Administered 2013-03-16: 1000 mL

## 2013-03-16 MED ORDER — ONDANSETRON HCL 4 MG/2ML IJ SOLN
INTRAMUSCULAR | Status: DC | PRN
  Administered 2013-03-16: 4 mg via INTRAVENOUS

## 2013-03-16 MED ORDER — SODIUM CHLORIDE 0.9 % IV SOLN
INTRAVENOUS | Status: DC | PRN
  Administered 2013-03-16 (×2)

## 2013-03-16 MED ORDER — ACETAMINOPHEN 325 MG PO TABS: 650.0000 mg | ORAL_TABLET | ORAL | Status: DC | PRN

## 2013-03-16 MED ORDER — GLYCOPYRROLATE 0.2 MG/ML IJ SOLN
INTRAMUSCULAR | Status: DC | PRN
  Administered 2013-03-16: 0.6 mg via INTRAVENOUS

## 2013-03-16 MED ORDER — ROCURONIUM BROMIDE 100 MG/10ML IV SOLN
INTRAVENOUS | Status: DC | PRN
  Administered 2013-03-16: 40 mg via INTRAVENOUS

## 2013-03-16 MED ORDER — HYDROCODONE-ACETAMINOPHEN 5-325 MG PO TABS: 1.0000 | ORAL_TABLET | ORAL | Status: DC | PRN

## 2013-03-16 MED ORDER — KCL IN DEXTROSE-NACL 20-5-0.45 MEQ/L-%-% IV SOLN
INTRAVENOUS | Status: DC
  Administered 2013-03-16: 16:00:00 via INTRAVENOUS
  Filled 2013-03-16: qty 1000

## 2013-03-16 MED ORDER — HYDROMORPHONE HCL PF 1 MG/ML IJ SOLN
INTRAMUSCULAR | Status: AC
  Filled 2013-03-16: qty 1

## 2013-03-16 MED ORDER — HYDROCODONE-ACETAMINOPHEN 5-325 MG PO TABS
1.0000 | ORAL_TABLET | ORAL | Status: DC | PRN
  Administered 2013-03-16: 2 via ORAL
  Filled 2013-03-16: qty 2

## 2013-03-16 MED ORDER — ONDANSETRON HCL 4 MG/2ML IJ SOLN
INTRAMUSCULAR | Status: AC
  Administered 2013-03-16: 4 mg via INTRAVENOUS
  Filled 2013-03-16: qty 2

## 2013-03-16 MED ORDER — HYDROMORPHONE HCL PF 1 MG/ML IJ SOLN: 1.0000 mg | INTRAMUSCULAR | Status: DC | PRN

## 2013-03-16 MED ORDER — BUPIVACAINE-EPINEPHRINE (PF) 0.25% -1:200000 IJ SOLN
INTRAMUSCULAR | Status: AC
  Filled 2013-03-16: qty 30

## 2013-03-16 MED ORDER — NEOSTIGMINE METHYLSULFATE 1 MG/ML IJ SOLN
INTRAMUSCULAR | Status: DC | PRN
  Administered 2013-03-16: 4 mg via INTRAVENOUS

## 2013-03-16 MED ORDER — HYDROMORPHONE HCL PF 1 MG/ML IJ SOLN
INTRAMUSCULAR | Status: AC
  Administered 2013-03-16: 0.5 mg via INTRAVENOUS
  Filled 2013-03-16: qty 1

## 2013-03-16 MED ORDER — LIDOCAINE HCL (CARDIAC) 20 MG/ML IV SOLN
INTRAVENOUS | Status: DC | PRN
  Administered 2013-03-16: 50 mg via INTRAVENOUS

## 2013-03-16 MED ORDER — HYDROMORPHONE HCL PF 1 MG/ML IJ SOLN
0.2500 mg | INTRAMUSCULAR | Status: DC | PRN
  Administered 2013-03-16 (×4): 0.5 mg via INTRAVENOUS

## 2013-03-16 MED ORDER — MIDAZOLAM HCL 5 MG/5ML IJ SOLN
INTRAMUSCULAR | Status: DC | PRN
  Administered 2013-03-16: 2 mg via INTRAVENOUS

## 2013-03-16 MED ORDER — ONDANSETRON HCL 4 MG PO TABS: 4.0000 mg | ORAL_TABLET | Freq: Four times a day (QID) | ORAL | Status: DC | PRN

## 2013-03-16 MED ORDER — ONDANSETRON HCL 4 MG/2ML IJ SOLN: 4.0000 mg | Freq: Four times a day (QID) | INTRAMUSCULAR | Status: DC | PRN

## 2013-03-16 MED ORDER — FENTANYL CITRATE 0.05 MG/ML IJ SOLN
INTRAMUSCULAR | Status: DC | PRN
  Administered 2013-03-16: 50 ug via INTRAVENOUS
  Administered 2013-03-16: 100 ug via INTRAVENOUS
  Administered 2013-03-16 (×2): 50 ug via INTRAVENOUS

## 2013-03-16 MED ORDER — BUPIVACAINE-EPINEPHRINE 0.25% -1:200000 IJ SOLN
INTRAMUSCULAR | Status: DC | PRN
  Administered 2013-03-16: 20 mL

## 2013-03-16 MED ORDER — SODIUM CHLORIDE 0.9 % IR SOLN
Status: DC | PRN
  Administered 2013-03-16: 1000 mL

## 2013-03-16 MED ORDER — OXYCODONE HCL 5 MG/5ML PO SOLN
5.0000 mg | Freq: Once | ORAL | Status: DC | PRN
Start: 2013-03-16 — End: 2013-03-16

## 2013-03-16 MED ORDER — OXYCODONE HCL 5 MG PO TABS: 5.0000 mg | ORAL_TABLET | Freq: Once | ORAL | Status: DC | PRN

## 2013-03-16 MED ORDER — LACTATED RINGERS IV SOLN
INTRAVENOUS | Status: DC
  Administered 2013-03-16: 10:00:00 via INTRAVENOUS

## 2013-03-16 MED ORDER — LACTATED RINGERS IV SOLN
INTRAVENOUS | Status: DC | PRN
  Administered 2013-03-16 (×2): via INTRAVENOUS

## 2013-03-16 MED ORDER — CEFAZOLIN SODIUM-DEXTROSE 2-3 GM-% IV SOLR
INTRAVENOUS | Status: AC
  Filled 2013-03-16: qty 50

## 2013-03-16 SURGICAL SUPPLY — 43 items
APPLIER CLIP ROT 10 11.4 M/L (STAPLE) ×2
BENZOIN TINCTURE PRP APPL 2/3 (GAUZE/BANDAGES/DRESSINGS) ×2 IMPLANT
CANISTER SUCTION 2500CC (MISCELLANEOUS) ×2 IMPLANT
CHLORAPREP W/TINT 26ML (MISCELLANEOUS) ×2 IMPLANT
CLIP APPLIE ROT 10 11.4 M/L (STAPLE) ×1 IMPLANT
COVER MAYO STAND STRL (DRAPES) ×2 IMPLANT
COVER SURGICAL LIGHT HANDLE (MISCELLANEOUS) ×2 IMPLANT
DECANTER SPIKE VIAL GLASS SM (MISCELLANEOUS) IMPLANT
DRAPE C-ARM 42X72 X-RAY (DRAPES) ×2 IMPLANT
DRAPE UTILITY 15X26 W/TAPE STR (DRAPE) ×4 IMPLANT
ELECT REM PT RETURN 9FT ADLT (ELECTROSURGICAL) ×2
ELECTRODE REM PT RTRN 9FT ADLT (ELECTROSURGICAL) ×1 IMPLANT
GAUZE SPONGE 2X2 8PLY STRL LF (GAUZE/BANDAGES/DRESSINGS) ×1 IMPLANT
GLOVE BIO SURGEON STRL SZ7.5 (GLOVE) ×2 IMPLANT
GLOVE BIOGEL PI IND STRL 7.0 (GLOVE) ×3 IMPLANT
GLOVE BIOGEL PI IND STRL 7.5 (GLOVE) ×1 IMPLANT
GLOVE BIOGEL PI INDICATOR 7.0 (GLOVE) ×3
GLOVE BIOGEL PI INDICATOR 7.5 (GLOVE) ×1
GLOVE SURG ORTHO 8.0 STRL STRW (GLOVE) ×2 IMPLANT
GLOVE SURG SS PI 7.0 STRL IVOR (GLOVE) ×2 IMPLANT
GOWN STRL NON-REIN LRG LVL3 (GOWN DISPOSABLE) ×6 IMPLANT
GOWN STRL REIN XL XLG (GOWN DISPOSABLE) ×2 IMPLANT
KIT BASIN OR (CUSTOM PROCEDURE TRAY) ×2 IMPLANT
KIT ROOM TURNOVER OR (KITS) ×2 IMPLANT
NS IRRIG 1000ML POUR BTL (IV SOLUTION) ×2 IMPLANT
PAD ARMBOARD 7.5X6 YLW CONV (MISCELLANEOUS) ×4 IMPLANT
POUCH SPECIMEN RETRIEVAL 10MM (ENDOMECHANICALS) ×2 IMPLANT
SCISSORS LAP 5X35 DISP (ENDOMECHANICALS) ×2 IMPLANT
SET CHOLANGIOGRAPH 5 50 .035 (SET/KITS/TRAYS/PACK) ×2 IMPLANT
SET IRRIG TUBING LAPAROSCOPIC (IRRIGATION / IRRIGATOR) ×2 IMPLANT
SLEEVE ENDOPATH XCEL 5M (ENDOMECHANICALS) ×2 IMPLANT
SPECIMEN JAR SMALL (MISCELLANEOUS) ×2 IMPLANT
SPONGE GAUZE 2X2 STER 10/PKG (GAUZE/BANDAGES/DRESSINGS) ×1
STRIP CLOSURE SKIN 1/4X4 (GAUZE/BANDAGES/DRESSINGS) ×2 IMPLANT
SUT MNCRL AB 4-0 PS2 18 (SUTURE) ×2 IMPLANT
TAPE CLOTH SURG 6X10 WHT LF (GAUZE/BANDAGES/DRESSINGS) ×2 IMPLANT
TOWEL OR 17X24 6PK STRL BLUE (TOWEL DISPOSABLE) IMPLANT
TOWEL OR 17X26 10 PK STRL BLUE (TOWEL DISPOSABLE) ×2 IMPLANT
TRAY LAPAROSCOPIC (CUSTOM PROCEDURE TRAY) ×2 IMPLANT
TROCAR XCEL BLUNT TIP 100MML (ENDOMECHANICALS) ×2 IMPLANT
TROCAR XCEL NON-BLD 11X100MML (ENDOMECHANICALS) ×2 IMPLANT
TROCAR XCEL NON-BLD 5MMX100MML (ENDOMECHANICALS) ×2 IMPLANT
WATER STERILE IRR 1000ML POUR (IV SOLUTION) IMPLANT

## 2013-03-16 NOTE — Anesthesia Procedure Notes (Signed)
Procedure Name: Intubation Date/Time: 03/16/2013 11:30 AM Performed by: Brien Mates D Pre-anesthesia Checklist: Patient identified, Emergency Drugs available, Suction available, Timeout performed and Patient being monitored Patient Re-evaluated:Patient Re-evaluated prior to inductionOxygen Delivery Method: Circle system utilized Preoxygenation: Pre-oxygenation with 100% oxygen Intubation Type: IV induction Ventilation: Mask ventilation without difficulty Laryngoscope Size: Miller and 2 Grade View: Grade I Tube type: Oral Tube size: 7.5 mm Number of attempts: 1 Airway Equipment and Method: Stylet Placement Confirmation: ETT inserted through vocal cords under direct vision,  positive ETCO2 and breath sounds checked- equal and bilateral Secured at: 21 cm Tube secured with: Tape Dental Injury: Teeth and Oropharynx as per pre-operative assessment

## 2013-03-16 NOTE — Progress Notes (Signed)
Discharge instructions and prescriptions given to pt.  Pt. Verbalizes understanding.  IV discontinued.  Pt. Discharged to home with mother. Vanice Sarah

## 2013-03-16 NOTE — H&P (View-Only) (Signed)
[facility]  Chief Complaint  Patient presents with  . New Evaluation    gallstones - referral from Dr. Maurice Small    HISTORY: Patient is a 32 year old female referred by her primary care physician for management of symptomatic cholelithiasis. Patient has had a several month history of intermittent right upper quadrant and epigastric abdominal pain. In the past she has noted fatty food intolerance. She denies any history of jaundice or acholic stools. She denies fevers or chills. She has experienced nausea but no emesis. Patient does note that her mother underwent cholecystectomy for gallstones.  Ultrasound was performed. This showed multiple gallstones. There were no acute inflammatory changes. There was no biliary dilatation. Liver was normal.  Past Medical History  Diagnosis Date  . Asthma   . Postpartum care following vaginal delivery (8/18) 11/20/2012    Current Outpatient Prescriptions  Medication Sig Dispense Refill  . albuterol (PROVENTIL) (2.5 MG/3ML) 0.083% nebulizer solution Take 2.5 mg by nebulization every 6 (six) hours as needed for wheezing.      . cetirizine (ZYRTEC) 10 MG tablet Take 10 mg by mouth daily.      . DEBLITANE 0.35 MG tablet       . montelukast (SINGULAIR) 10 MG tablet Take 10 mg by mouth at bedtime.      . Prenatal Vit-Fe Fumarate-FA (MULTIVITAMIN-PRENATAL) 27-0.8 MG TABS tablet Take 1 tablet by mouth daily at 12 noon.      . therapeutic multivitamin-minerals (THERAGRAN-M) tablet Take 1 tablet by mouth daily.      Marland Kitchen docusate sodium (COLACE) 100 MG capsule Take 1 capsule (100 mg total) by mouth 2 (two) times daily as needed for constipation.  60 capsule  1  . ibuprofen (ADVIL,MOTRIN) 600 MG tablet Take 1 tablet (600 mg total) by mouth every 6 (six) hours as needed for pain.  30 tablet  1  . oxyCODONE-acetaminophen (PERCOCET/ROXICET) 5-325 MG per tablet Take 1-2 tablets by mouth every 6 (six) hours as needed.  12 tablet   0   No current facility-administered medications for this visit.    No Known Allergies  Family History  Problem Relation Age of Onset  . Cancer Maternal Grandmother     breast    History   Social History  . Marital Status: Married    Spouse Name: N/A    Number of Children: N/A  . Years of Education: N/A   Social History Main Topics  . Smoking status: Never Smoker   . Smokeless tobacco: None  . Alcohol Use: No  . Drug Use: No  . Sexual Activity: Yes    Birth Control/ Protection: None   Other Topics Concern  . None   Social History Narrative  . None    REVIEW OF SYSTEMS - PERTINENT POSITIVES ONLY: Intermittent epigastric and right upper quadrant abdominal pain. Denies jaundice. Denies acholic stools. Denies fever. Denies chills. Occasional nausea. History of fatty food intolerance.  EXAM: Filed Vitals:   02/27/13 1640  BP: 122/62  Pulse: 67  Temp: 98 F (36.7 C)  Resp: 18    HEENT: normocephalic; pupils equal and reactive; sclerae clear; dentition good; mucous membranes moist NECK:  symmetric on extension; no palpable anterior or posterior cervical lymphadenopathy; no supraclavicular masses; no tenderness CHEST: clear to auscultation bilaterally without rales, rhonchi, or wheezes CARDIAC: regular rate and rhythm without significant murmur; peripheral pulses are full ABDOMEN: soft without distension; bowel sounds present; no mass; no hepatosplenomegaly; no hernia EXT:  non-tender  without edema; no deformity NEURO: no gross focal deficits; no sign of tremor   LABORATORY RESULTS: See [facility] (CHL-Epic) for most recent results  RADIOLOGY RESULTS: See [facility] (CHL-Epic) for most recent results  IMPRESSION: Symptomatic cholelithiasis, probable chronic cholecystitis  PLAN: Patient and I had a lengthy discussion regarding cholecystectomy. I provided her with written literature to review at home. We discussed laparoscopic cholecystectomy with  intraoperative cholangiography. We discussed the risk and benefits of the procedure. We discussed the potential for conversion to open surgery. We discussed the hospital stay to be anticipated. We discussed the postoperative recovery and return to work and activities. She understands and wishes to proceed.  We will need to be conscious of the fact that she is 3 months postpartum and is breast feeding. We will bring this to the attention of the anesthesiologist.  The risks and benefits of the procedure have been discussed at length with the patient.  The patient understands the proposed procedure, potential alternative treatments, and the course of recovery to be expected.  All of the patient's questions have been answered at this time.  The patient wishes to proceed with surgery.  Velora Heckler, MD, FACS General & Endocrine Surgery [facility]  Primary Care Physician: Astrid Divine, MD

## 2013-03-16 NOTE — Interval H&P Note (Signed)
History and Physical Interval Note:  03/16/2013 10:33 AM  Laura Bruce  has presented today for surgery, with the diagnosis of gallstones, abdominal pain.  The various methods of treatment have been discussed with the patient and family. After consideration of risks, benefits and other options for treatment, the patient has consented to    Procedure(s): LAPAROSCOPIC CHOLECYSTECTOMY WITH INTRAOPERATIVE CHOLANGIOGRAM (N/A) as a surgical intervention .    The patient's history has been reviewed, patient examined, no change in status, stable for surgery.  I have reviewed the patient's chart and labs.  Questions were answered to the patient's satisfaction.    Velora Heckler, MD, [facility] Office: [phone]   [name]

## 2013-03-16 NOTE — Discharge Summary (Signed)
Physician Discharge Summary [facility]  Patient ID: Laura Bruce MRN: [id number] DOB/AGE: 34-May-1982 34 y.o.  Admit date: 03/16/2013 Discharge date: 03/16/2013  Admission Diagnoses:  Symptomatic cholelithiasis  Discharge Diagnoses:  Principal Problem:   Cholelithiasis with cholecystitis Active Problems:   Cholelithiasis   Discharged Condition: good  Hospital Course: Patient is a 32 year old female referred by her primary care physician for management of symptomatic cholelithiasis. Patient has had a several month history of intermittent right upper quadrant and epigastric abdominal pain. In the past she has noted fatty food intolerance. She denies any history of jaundice or acholic stools. She denies fevers or chills. She has experienced nausea but no emesis. Patient does note that her mother underwent cholecystectomy for gallstones. Ultrasound was performed. This showed multiple gallstones. There were no acute inflammatory changes. There was no biliary dilatation. Liver was normal.  Following cholecystectomy, patient admitted for observation.  Tolerated diet, ambulated, and voided.  Patient and family desired discharge home on day of surgery.  Consults: None  Significant Diagnostic Studies: none  Treatments: surgery: lap cholecystectomy with IOC  Discharge Exam: Blood pressure 107/66, pulse 104, temperature 98.2 F (36.8 C), temperature source Oral, resp. rate 17, SpO2 99.00%. HEENT - clear Neck - soft Chest - clear bilaterally Cor - RRR Abd - dressings dry and intact; soft without distension  Disposition: Home with family  Discharge Orders   Future Appointments Provider Department Dept Phone   04/02/2013 8:30 AM Velora Heckler, MD [facility], [place] [phone]   Future Orders Complete By Expires   Diet - low sodium heart healthy  As directed    Discharge instructions  As directed    Comments:     [facility]  LAPAROSCOPIC SURGERY - POST-OP INSTRUCTIONS  Always review your discharge instruction sheet given to you by the facility where your surgery was performed.  A prescription for pain medication may be given to you upon discharge.  Take your pain medication as prescribed.  If narcotic pain medicine is not needed, then you may take acetaminophen (Tylenol) or ibuprofen (Advil) as needed.  Take your usually prescribed medications unless otherwise directed.  If you need a refill on your pain medication, please contact your pharmacy.  They will contact our office to request authorization. Prescriptions will not be filled after 5 P.M. or on weekends.  You should follow a light diet the first few days after arrival home, such as soup and crackers or toast.  Be sure to include plenty of fluids daily.  Most patients will experience some swelling and bruising in the area of the incisions.  Ice packs will help.  Swelling and bruising can take several days to resolve.   It is common to experience some constipation if taking pain medication after surgery.  Increasing fluid intake and taking a stool softener (such as Colace) will usually help or prevent this problem from occurring.  A mild laxative (Milk of Magnesia or Miralax) should be taken according to package instructions if there are no bowel movements after 48 hours.  Unless discharge instructions indicate otherwise, you may remove your bandages 24-48 hours after surgery, and you may shower at that time.  You may have steri-strips (small skin tapes) in place directly over the incision.  These strips should be left on the skin for 7-10 days.  If your surgeon used skin glue on the incision, you may shower in 24 hours.  The glue will flake off over the  next 2-3 weeks.  Any sutures or staples will be removed at the office during your follow-up visit.  ACTIVITIES:  You may resume regular (light) daily activities beginning the next day-such as daily  self-care, walking, climbing stairs-gradually increasing activities as tolerated.  You may have sexual intercourse when it is comfortable.  Refrain from any heavy lifting or straining until approved by your doctor.  You may drive when you are no longer taking prescription pain medication, you can comfortably wear a seatbelt, and you can safely maneuver your car and apply brakes.  You should see your doctor in the office for a follow-up appointment approximately 2-3 weeks after your surgery.  Make sure that you call for this appointment within a day or two after you arrive home to insure a convenient appointment time.  WHEN TO CALL YOUR DOCTOR: Fever over 101.0 Inability to urinate Continued bleeding from incision Increased pain, redness, or drainage from the incision Increasing abdominal pain  The clinic staff is available to answer your questions during regular business hours.  Please don't hesitate to call and ask to speak to one of the nurses for clinical concerns.  If you have a medical emergency, go to the nearest emergency room or call 911.  A surgeon from [facility] is always on call for the hospital.  Velora Heckler, MD, [facility] Office: [phone] Toll Free:  [phone] FAX [phone]  Web site: [email or web address]   Increase activity slowly  As directed    Remove dressing in 48 hours  As directed        Medication List         albuterol (2.5 MG/3ML) 0.083% nebulizer solution  Commonly known as:  PROVENTIL  Take 2.5 mg by nebulization every 6 (six) hours as needed for wheezing.     calcium carbonate 1250 MG chewable tablet  Commonly known as:  OS-CAL  Chew 1 tablet by mouth daily.     cetirizine 10 MG tablet  Commonly known as:  ZYRTEC  Take 10 mg by mouth daily.     DEBLITANE 0.35 MG tablet  Generic drug:  norethindrone  Take 1 tablet by mouth daily.     fluticasone 50 MCG/ACT nasal spray  Commonly  known as:  FLONASE  Place 2 sprays into both nostrils daily.     HYDROcodone-acetaminophen 5-325 MG per tablet  Commonly known as:  NORCO/VICODIN  Take 1-2 tablets by mouth every 4 (four) hours as needed for moderate pain.     montelukast 10 MG tablet  Commonly known as:  SINGULAIR  Take 10 mg by mouth at bedtime.     multivitamin-prenatal 27-0.8 MG Tabs tablet  Take 1 tablet by mouth daily at 12 noon.     therapeutic multivitamin-minerals tablet  Take 1 tablet by mouth daily.           Follow-up Information   Follow up with Velora Heckler, MD. Schedule an appointment as soon as possible for a visit in 3 weeks.   Specialty:  General Surgery   Contact information:   [address] [place] [address] [phone]       Velora Heckler, MD, [facility] Office: [phone]   Signed: Velora Heckler 03/16/2013, 10:38 PM

## 2013-03-16 NOTE — Transfer of Care (Signed)
Immediate Anesthesia Transfer of Care Note  Patient: Laura Bruce  Procedure(s) Performed: Procedure(s): LAPAROSCOPIC CHOLECYSTECTOMY WITH INTRAOPERATIVE CHOLANGIOGRAM (N/A)  Patient Location: PACU  Anesthesia Type:General  Level of Consciousness: awake  Airway & Oxygen Therapy: Patient Spontanous Breathing  Post-op Assessment: Report given to PACU RN and Post -op Vital signs reviewed and stable  Post vital signs: Reviewed and stable  Complications: No apparent anesthesia complications

## 2013-03-16 NOTE — Op Note (Signed)
Procedure Note  Pre-operative Diagnosis:  Symptomatic cholelithiasis  Post-operative Diagnosis:  same  Surgeon:  Velora Heckler, MD, FACS  Assistant:  Magnus Ivan, RNFA   Procedure:  Laparoscopic cholecystectomy with intra-operative cholangiography  Anesthesia:  General  Estimated Blood Loss:  minimal  Drains: none         Specimen: Gallbladder to pathology  Indications:  Patient is a 32 year old female referred by her primary care physician for management of symptomatic cholelithiasis. Patient has had a several month history of intermittent right upper quadrant and epigastric abdominal pain. In the past she has noted fatty food intolerance. She denies any history of jaundice or acholic stools. She denies fevers or chills. She has experienced nausea but no emesis. Patient does note that her mother underwent cholecystectomy for gallstones. Ultrasound was performed. This showed multiple gallstones. There were no acute inflammatory changes. There was no biliary dilatation. Liver was normal.   Procedure Details:  The patient was seen in the pre-op holding area. The risks, benefits, complications, treatment options, and expected outcomes have been discussed with the patient. The patient agreed with the proposed plan and signed the informed consent form.  The patient was taken to Operating Room, identified as Laura Bruce and the procedure verified as Laparoscopic Cholecystectomy with Intraoperative Cholangiogram. A "time out" was completed and the above information confirmed.  Following induction of general anesthesia, the patient was placed in the supine position. The abdomen was prepped and draped in the usual aseptic fashion.  An incision was made in the skin below the umbilicus. The midline fascia was incised and the peritoneal cavity entered and the Hasson canula was introduced under direct vision.  The Hasson canula was secured with a 0-Vicryl pursestring suture. Pneumoperitoneum  was established with carbon dioxide. Additional trocars were introduced under direct vision along the right costal margin in the midline, mid-clavicular line, and anterior axillary line.   The gallbladder was identified and the fundus grasped and retracted cephalad. Adhesions were taken down bluntly and the electrocautery was utilized as needed, taking care not to injure any adjacent structures. The infundibulum was grasped and retracted laterally, exposing the peritoneum overlying the triangle of Calot. The peritoneum was incised and structures exposed with blunt dissection. The cystic duct was clearly identified, bluntly dissected circumferentially, and clipped at the neck of the gallbladder.  An incision was made in the cystic duct and the cholangiogram catheter introduced. The catheter was secured using an ligaclip.  Real-time cholangiography was performed using C-arm fluoroscopy.  There was rapid filling of a normal caliber common bile duct.  There was reflux of contrast into the left and right hepatic ductal systems.  There was free flow distally into the duodenum without filling defect or obstruction.  Catheter was removed from the peritoneal cavity.  The cystic duct was then triply ligated with surgical clips and divided. The cystic artery was identified, dissected circumferentially, ligated with ligaclips, and divided.  The gallbladder was dissected away from the liver bed using the electrocautery for hemostasis. The gallbladder was completely removed from the liver and placed into an endocatch bag. The right upper quadrant was irrigated and the gallbladder bed was inspected. Hemostasis was achieved with the electrocautery. Warm saline irrigation was utilized until clear.  Pneumoperitoneum was released after viewing removal of the trocars with good hemostasis noted. The umbilical wound was irrigated and the fascia was then closed with the pursestring suture.  Local anesthetic was infiltrated at  all port sites. The skin incisions were  closed with 4-0 Monocril subcuticular sutures and steri-strips and dressings were applied.  Instrument, sponge, and needle counts were correct at the conclusion of the case.  The patient was awakened from anesthesia and brought to the recovery room in stable condition.  The patient tolerated the procedure well.   Velora Heckler, MD, [facility] Office: [phone]

## 2013-03-16 NOTE — Anesthesia Postprocedure Evaluation (Signed)
Anesthesia Post Note  Patient: Laura Bruce  Procedure(s) Performed: Procedure(s) (LRB): LAPAROSCOPIC CHOLECYSTECTOMY WITH INTRAOPERATIVE CHOLANGIOGRAM (N/A)  Anesthesia type: General  Patient location: PACU  Post pain: Pain level controlled and Adequate analgesia  Post assessment: Post-op Vital signs reviewed, Patient's Cardiovascular Status Stable, Respiratory Function Stable, Patent Airway and Pain level controlled  Last Vitals:  Filed Vitals:   03/16/13 1315  BP: 115/78  Pulse: 65  Temp:   Resp: 16    Post vital signs: Reviewed and stable  Level of consciousness: awake, alert  and oriented  Complications: No apparent anesthesia complications

## 2013-03-16 NOTE — Anesthesia Preprocedure Evaluation (Addendum)
Anesthesia Evaluation  Patient identified by MRN, date of birth, ID band Patient awake    Reviewed: Allergy & Precautions, H&P , NPO status , Patient's Chart, lab work & pertinent test results  Airway Mallampati: II  Neck ROM: full    Dental  (+) Teeth Intact and Dental Advisory Given   Pulmonary asthma ,          Cardiovascular negative cardio ROS      Neuro/Psych    GI/Hepatic   Endo/Other    Renal/GU      Musculoskeletal   Abdominal   Peds  Hematology   Anesthesia Other Findings   Reproductive/Obstetrics                          Anesthesia Physical Anesthesia Plan  ASA: II  Anesthesia Plan: General   Post-op Pain Management:    Induction: Intravenous  Airway Management Planned: Oral ETT  Additional Equipment:   Intra-op Plan:   Post-operative Plan: Extubation in OR  Informed Consent: I have reviewed the patients History and Physical, chart, labs and discussed the procedure including the risks, benefits and alternatives for the proposed anesthesia with the patient or authorized representative who has indicated his/her understanding and acceptance.   Dental advisory given  Plan Discussed with: CRNA, Anesthesiologist and Surgeon  Anesthesia Plan Comments:        Anesthesia Quick Evaluation

## 2013-03-20 ENCOUNTER — Encounter (HOSPITAL_COMMUNITY): Payer: Self-pay | Admitting: Surgery

## 2013-04-02 ENCOUNTER — Ambulatory Visit (INDEPENDENT_AMBULATORY_CARE_PROVIDER_SITE_OTHER): Payer: BC Managed Care – PPO | Admitting: Surgery

## 2013-04-02 ENCOUNTER — Encounter (INDEPENDENT_AMBULATORY_CARE_PROVIDER_SITE_OTHER): Payer: Self-pay

## 2013-04-02 ENCOUNTER — Encounter (INDEPENDENT_AMBULATORY_CARE_PROVIDER_SITE_OTHER): Payer: Self-pay | Admitting: Surgery

## 2013-04-02 VITALS — BP 116/68 | HR 72 | Temp 98.2°F | Resp 14 | Ht 63.0 in | Wt 147.4 lb

## 2013-04-02 DIAGNOSIS — K8010 Calculus of gallbladder with chronic cholecystitis without obstruction: Secondary | ICD-10-CM

## 2013-04-02 NOTE — Progress Notes (Signed)
General Surgery [facility]  Chief Complaint  Patient presents with  . Routine Post Op    lap cholecystectomy 03/16/2013    HISTORY: Patient is a 32 year old female who underwent laparoscopic cholecystectomy on 03/16/2013. Postoperative course was uneventful. Final pathology shows chronic cholecystitis and cholelithiasis.  EXAM: Abdomen is soft, nontender, without distention. Surgical wounds are well healed. No sign of herniation. Right upper quadrant is soft without palpable mass.  IMPRESSION: Status post laparoscopic cholecystectomy for chronic cholecystitis and cholelithiasis  PLAN: The patient will begin applying topical creams to her incisions. She is released to full activity without restriction. Diet is unrestricted.  Patient will return for surgical care as needed.  Velora Heckler, MD, FACS General & Endocrine Surgery [facility]   Visit Diagnoses: 1. Cholelithiasis with cholecystitis

## 2013-04-02 NOTE — Patient Instructions (Signed)
  COCOA BUTTER & VITAMIN E CREAM  (Palmer's or other brand)  Apply cocoa butter/vitamin E cream to your incision 2 - 3 times daily.  Massage cream into incision for one minute with each application.  Use sunscreen (50 SPF or higher) for first 6 months after surgery if area is exposed to sun.  You may substitute Mederma or other scar reducing creams as desired.   

## 2013-04-09 ENCOUNTER — Encounter (INDEPENDENT_AMBULATORY_CARE_PROVIDER_SITE_OTHER): Payer: BC Managed Care – PPO | Admitting: Surgery

## 2013-10-09 LAB — OB RESULTS CONSOLE HIV ANTIBODY (ROUTINE TESTING): HIV: NONREACTIVE

## 2013-10-09 LAB — OB RESULTS CONSOLE ABO/RH: RH Type: POSITIVE

## 2013-10-09 LAB — OB RESULTS CONSOLE ANTIBODY SCREEN: Antibody Screen: NEGATIVE

## 2013-10-09 LAB — OB RESULTS CONSOLE GC/CHLAMYDIA
Chlamydia: NEGATIVE
Gonorrhea: NEGATIVE

## 2013-10-09 LAB — OB RESULTS CONSOLE RPR: RPR: NONREACTIVE

## 2013-10-09 LAB — OB RESULTS CONSOLE HEPATITIS B SURFACE ANTIGEN: Hepatitis B Surface Ag: NEGATIVE

## 2013-10-09 LAB — OB RESULTS CONSOLE RUBELLA ANTIBODY, IGM: Rubella: UNDETERMINED

## 2014-02-04 ENCOUNTER — Encounter (INDEPENDENT_AMBULATORY_CARE_PROVIDER_SITE_OTHER): Payer: Self-pay | Admitting: Surgery

## 2014-04-05 NOTE — L&D Delivery Note (Signed)
Delivery Note At 3:26 PM a viable and healthy female was delivered via  (Presentation: LOA  ).  APGAR: 9, 9; weight  pending.   Placenta status: spontaneous, intact.  Cord:  with the following complications: none.  Cord pH: na  Anesthesia:  epidural Episiotomy:  none Lacerations:  none Suture Repair: na Est. Blood Loss (mL):  200  Mom to postpartum.  Baby to Couplet care / Skin to Skin.  Kapena Hamme J 05/26/2014, 3:35 PM

## 2014-04-30 LAB — OB RESULTS CONSOLE GBS: GBS: NEGATIVE

## 2014-05-26 ENCOUNTER — Encounter (HOSPITAL_COMMUNITY): Payer: Self-pay | Admitting: *Deleted

## 2014-05-26 ENCOUNTER — Inpatient Hospital Stay (HOSPITAL_COMMUNITY): Payer: BLUE CROSS/BLUE SHIELD | Admitting: Anesthesiology

## 2014-05-26 ENCOUNTER — Inpatient Hospital Stay (HOSPITAL_COMMUNITY)
Admission: AD | Admit: 2014-05-26 | Discharge: 2014-05-27 | DRG: 775 | Disposition: A | Discharge status: Home / Self Care | Payer: BLUE CROSS/BLUE SHIELD | Source: Ambulatory Visit | Attending: Obstetrics and Gynecology | Admitting: Obstetrics and Gynecology

## 2014-05-26 DIAGNOSIS — O9989 Other specified diseases and conditions complicating pregnancy, childbirth and the puerperium: Secondary | ICD-10-CM

## 2014-05-26 DIAGNOSIS — D649 Anemia, unspecified: Secondary | ICD-10-CM | POA: Diagnosis not present

## 2014-05-26 DIAGNOSIS — O480 Post-term pregnancy: Secondary | ICD-10-CM | POA: Diagnosis present

## 2014-05-26 DIAGNOSIS — Z3A40 40 weeks gestation of pregnancy: Secondary | ICD-10-CM | POA: Diagnosis present

## 2014-05-26 DIAGNOSIS — O09523 Supervision of elderly multigravida, third trimester: Secondary | ICD-10-CM | POA: Diagnosis not present

## 2014-05-26 DIAGNOSIS — Z283 Underimmunization status: Secondary | ICD-10-CM

## 2014-05-26 DIAGNOSIS — Z3483 Encounter for supervision of other normal pregnancy, third trimester: Secondary | ICD-10-CM | POA: Diagnosis present

## 2014-05-26 DIAGNOSIS — Z2839 Other underimmunization status: Secondary | ICD-10-CM

## 2014-05-26 DIAGNOSIS — O99892 Other specified diseases and conditions complicating childbirth: Secondary | ICD-10-CM

## 2014-05-26 DIAGNOSIS — O9902 Anemia complicating childbirth: Secondary | ICD-10-CM | POA: Diagnosis present

## 2014-05-26 LAB — CBC
HCT: 34.7 % — ABNORMAL LOW (ref 36.0–46.0)
Hemoglobin: 11.5 g/dL — ABNORMAL LOW (ref 12.0–15.0)
MCH: 27 pg (ref 26.0–34.0)
MCHC: 33.1 g/dL (ref 30.0–36.0)
MCV: 81.5 fL (ref 78.0–100.0)
Platelets: 201 10*3/uL (ref 150–400)
RBC: 4.26 MIL/uL (ref 3.87–5.11)
RDW: 14.2 % (ref 11.5–15.5)
WBC: 9.5 10*3/uL (ref 4.0–10.5)

## 2014-05-26 LAB — POCT FERN TEST: POCT Fern Test: POSITIVE

## 2014-05-26 MED ORDER — ONDANSETRON HCL 4 MG PO TABS: 4.0000 mg | ORAL_TABLET | ORAL | Status: DC | PRN

## 2014-05-26 MED ORDER — LIDOCAINE HCL (PF) 1 % IJ SOLN
30.0000 mL | INTRAMUSCULAR | Status: DC | PRN
  Filled 2014-05-26: qty 30

## 2014-05-26 MED ORDER — OXYTOCIN 40 UNITS IN LACTATED RINGERS INFUSION - SIMPLE MED: 62.5000 mL/h | INTRAVENOUS | Status: DC

## 2014-05-26 MED ORDER — TERBUTALINE SULFATE 1 MG/ML IJ SOLN
0.2500 mg | Freq: Once | INTRAMUSCULAR | Status: DC | PRN
  Filled 2014-05-26: qty 1

## 2014-05-26 MED ORDER — OXYCODONE-ACETAMINOPHEN 5-325 MG PO TABS: 2.0000 | ORAL_TABLET | ORAL | Status: DC | PRN

## 2014-05-26 MED ORDER — PHENYLEPHRINE 40 MCG/ML (10ML) SYRINGE FOR IV PUSH (FOR BLOOD PRESSURE SUPPORT)
80.0000 ug | PREFILLED_SYRINGE | INTRAVENOUS | Status: DC | PRN
  Filled 2014-05-26: qty 2
  Filled 2014-05-26: qty 20

## 2014-05-26 MED ORDER — METHYLERGONOVINE MALEATE 0.2 MG/ML IJ SOLN: 0.2000 mg | INTRAMUSCULAR | Status: DC | PRN

## 2014-05-26 MED ORDER — LACTATED RINGERS IV SOLN
INTRAVENOUS | Status: DC
  Administered 2014-05-26 (×2): 1000 mL via INTRAVENOUS

## 2014-05-26 MED ORDER — EPHEDRINE 5 MG/ML INJ
10.0000 mg | INTRAVENOUS | Status: DC | PRN
  Filled 2014-05-26: qty 2

## 2014-05-26 MED ORDER — ZOLPIDEM TARTRATE 5 MG PO TABS: 5.0000 mg | ORAL_TABLET | Freq: Every evening | ORAL | Status: DC | PRN

## 2014-05-26 MED ORDER — LIDOCAINE HCL (PF) 1 % IJ SOLN
INTRAMUSCULAR | Status: DC | PRN
  Administered 2014-05-26: 5 mL
  Administered 2014-05-26: 3 mL
  Administered 2014-05-26: 5 mL

## 2014-05-26 MED ORDER — ONDANSETRON HCL 4 MG/2ML IJ SOLN: 4.0000 mg | Freq: Four times a day (QID) | INTRAMUSCULAR | Status: DC | PRN

## 2014-05-26 MED ORDER — ACETAMINOPHEN 325 MG PO TABS: 650.0000 mg | ORAL_TABLET | ORAL | Status: DC | PRN

## 2014-05-26 MED ORDER — WITCH HAZEL-GLYCERIN EX PADS: 1.0000 "application " | MEDICATED_PAD | CUTANEOUS | Status: DC | PRN

## 2014-05-26 MED ORDER — SIMETHICONE 80 MG PO CHEW: 80.0000 mg | CHEWABLE_TABLET | ORAL | Status: DC | PRN

## 2014-05-26 MED ORDER — DIBUCAINE 1 % RE OINT: 1.0000 "application " | TOPICAL_OINTMENT | RECTAL | Status: DC | PRN

## 2014-05-26 MED ORDER — LACTATED RINGERS IV SOLN: 500.0000 mL | INTRAVENOUS | Status: DC | PRN

## 2014-05-26 MED ORDER — IBUPROFEN 600 MG PO TABS
600.0000 mg | ORAL_TABLET | Freq: Four times a day (QID) | ORAL | Status: DC
  Administered 2014-05-26 – 2014-05-27 (×4): 600 mg via ORAL
  Filled 2014-05-26 (×4): qty 1

## 2014-05-26 MED ORDER — FENTANYL 2.5 MCG/ML BUPIVACAINE 1/10 % EPIDURAL INFUSION (WH - ANES)
14.0000 mL/h | INTRAMUSCULAR | Status: DC | PRN
  Administered 2014-05-26: 14 mL/h via EPIDURAL
  Filled 2014-05-26: qty 125

## 2014-05-26 MED ORDER — FENTANYL 2.5 MCG/ML BUPIVACAINE 1/10 % EPIDURAL INFUSION (WH - ANES)
INTRAMUSCULAR | Status: DC | PRN
  Administered 2014-05-26: 14 mL/h via EPIDURAL

## 2014-05-26 MED ORDER — LACTATED RINGERS IV SOLN: 500.0000 mL | Freq: Once | INTRAVENOUS | Status: DC

## 2014-05-26 MED ORDER — OXYTOCIN 40 UNITS IN LACTATED RINGERS INFUSION - SIMPLE MED
1.0000 m[IU]/min | INTRAVENOUS | Status: DC
  Administered 2014-05-26: 2 m[IU]/min via INTRAVENOUS
  Filled 2014-05-26: qty 1000

## 2014-05-26 MED ORDER — TETANUS-DIPHTH-ACELL PERTUSSIS 5-2.5-18.5 LF-MCG/0.5 IM SUSP: 0.5000 mL | Freq: Once | INTRAMUSCULAR | Status: DC

## 2014-05-26 MED ORDER — OXYCODONE-ACETAMINOPHEN 5-325 MG PO TABS: 1.0000 | ORAL_TABLET | ORAL | Status: DC | PRN

## 2014-05-26 MED ORDER — CITRIC ACID-SODIUM CITRATE 334-500 MG/5ML PO SOLN: 30.0000 mL | ORAL | Status: DC | PRN

## 2014-05-26 MED ORDER — METHYLERGONOVINE MALEATE 0.2 MG PO TABS: 0.2000 mg | ORAL_TABLET | ORAL | Status: DC | PRN

## 2014-05-26 MED ORDER — OXYTOCIN BOLUS FROM INFUSION: 500.0000 mL | INTRAVENOUS | Status: DC

## 2014-05-26 MED ORDER — ONDANSETRON HCL 4 MG/2ML IJ SOLN: 4.0000 mg | INTRAMUSCULAR | Status: DC | PRN

## 2014-05-26 MED ORDER — BENZOCAINE-MENTHOL 20-0.5 % EX AERO: 1.0000 "application " | INHALATION_SPRAY | CUTANEOUS | Status: DC | PRN

## 2014-05-26 MED ORDER — SENNOSIDES-DOCUSATE SODIUM 8.6-50 MG PO TABS
2.0000 | ORAL_TABLET | ORAL | Status: DC
  Administered 2014-05-26: 2 via ORAL
  Filled 2014-05-26: qty 2

## 2014-05-26 MED ORDER — PHENYLEPHRINE 40 MCG/ML (10ML) SYRINGE FOR IV PUSH (FOR BLOOD PRESSURE SUPPORT)
80.0000 ug | PREFILLED_SYRINGE | INTRAVENOUS | Status: DC | PRN
  Administered 2014-05-26: 40 ug via INTRAVENOUS
  Administered 2014-05-26: 80 ug via INTRAVENOUS
  Filled 2014-05-26: qty 2

## 2014-05-26 MED ORDER — PRENATAL MULTIVITAMIN CH
1.0000 | ORAL_TABLET | Freq: Every day | ORAL | Status: DC
  Administered 2014-05-27: 1 via ORAL
  Filled 2014-05-26: qty 1

## 2014-05-26 MED ORDER — DIPHENHYDRAMINE HCL 50 MG/ML IJ SOLN: 12.5000 mg | INTRAMUSCULAR | Status: DC | PRN

## 2014-05-26 MED ORDER — LANOLIN HYDROUS EX OINT: TOPICAL_OINTMENT | CUTANEOUS | Status: DC | PRN

## 2014-05-26 MED ORDER — DIPHENHYDRAMINE HCL 25 MG PO CAPS: 25.0000 mg | ORAL_CAPSULE | Freq: Four times a day (QID) | ORAL | Status: DC | PRN

## 2014-05-26 NOTE — H&P (Signed)
OB ADMISSION/ HISTORY & PHYSICAL:  Admission Date: 05/26/2014  8:39 AM  Admit Diagnosis: 40 weeks Normal Labor, SROM at home - clear fluid  Laura Bruce is a 34 y.o. female presenting for normal active labor and SROM @ 0300 clear fluid.  Prenatal History: G2P1001   EDC : 05/26/2014, Date entered prior to episode creation  Prenatal care at [facility]  Primary Ob Provider: Dr. Ronita Hipps Prenatal course complicated by none  Prenatal Labs: ABO, Rh:  B Positive 10/09/13 Antibody:  Negative 10/09/13 Rubella:   Indeterminate 10/09/13 RPR:   NR 02/27/14 HBsAg:   NR 10/09/13 HIV:   NR 10/09/13 GTT: 121 02/27/14 GBS:   Negative 04/30/14  Medical / Surgical History :  Past medical history:  Past Medical History  Diagnosis Date  . Asthma   . Postpartum care following vaginal delivery (8/18) 11/20/2012     Past surgical history:  Past Surgical History  Procedure Laterality Date  . Cholecystectomy N/A 03/16/2013    Procedure: LAPAROSCOPIC CHOLECYSTECTOMY WITH INTRAOPERATIVE CHOLANGIOGRAM;  Surgeon: Earnstine Regal, MD;  Location: [facility] OR;  Service: General;  Laterality: N/A;    Family History:  Family History  Problem Relation Age of Onset  . Cancer Maternal Grandmother     breast     Social History:  reports that she has never smoked. She does not have any smokeless tobacco history on file. She reports that she does not drink alcohol or use illicit drugs.   Allergies: Review of patient's allergies indicates no known allergies.    Current Medications at time of admission:  Prior to Admission medications   Medication Sig Start Date End Date Taking? Authorizing Provider  albuterol (PROVENTIL HFA;VENTOLIN HFA) 108 (90 BASE) MCG/ACT inhaler Inhale 1-2 puffs into the lungs every 6 (six) hours as needed for wheezing or shortness of breath.   Yes Historical Provider, MD  calcium carbonate (OS-CAL) 1250 MG chewable tablet Chew 1 tablet by mouth daily.   Yes Historical Provider,  MD  cetirizine (ZYRTEC) 10 MG tablet Take 10 mg by mouth daily.   Yes Historical Provider, MD  fluticasone (FLONASE) 50 MCG/ACT nasal spray Place 2 sprays into both nostrils daily.   Yes Historical Provider, MD  montelukast (SINGULAIR) 10 MG tablet Take 10 mg by mouth at bedtime.   Yes Historical Provider, MD  Prenatal Vit-Fe Fumarate-FA (MULTIVITAMIN-PRENATAL) 27-0.8 MG TABS tablet Take 1 tablet by mouth daily at 12 noon.   Yes Historical Provider, MD  therapeutic multivitamin-minerals (THERAGRAN-M) tablet Take 1 tablet by mouth daily.   Yes Historical Provider, MD  albuterol (PROVENTIL) (2.5 MG/3ML) 0.083% nebulizer solution Take 2.5 mg by nebulization every 6 (six) hours as needed for wheezing.    Historical Provider, MD     Review of Systems: Active FM onset of ctx @ 0600 currently every 4-7 minutes LOF  / SROM @ 0300 at home - clear fluid  bloody show present with thick mucus   Physical Exam:  VS: Blood pressure 116/84, pulse 96, temperature 97.9 F (36.6 C), temperature source Oral, resp. rate 18.  General: alert and oriented, appears calm  Heart: RRR Lungs: Clear lung fields Abdomen: Gravid, soft and non-tender, non-distended  Uterus: non-tender, moderate contractions on palpation Extremities: no edema, negative Homan's sign  Genitalia / VE: Dilation: 4 Effacement (%): 90 Station: 0 Exam by:: Daisey Must CNM student  FHR: baseline rate 135 / Moderate variability 6-25bmp / accelerations + / no decelerations TOCO: irregular contractions on monitor  -  pt. reports every 5 minutes   Assessment: [redacted] weeks gestation SROM - clear fluid  Active stage of labor FHR category 1 Rubella Indeterminate    Plan:  Admit to Labor and Delivery  Dr Ronita Hipps notified of admission / plan of care - MD to follow Augmentation with Pitocin 2 milliunits x 2 milliunits  May have epidural upon request Plan for MMR booster postpartum  Anticipate NSVD   Kassie Mends [facility]  05/26/2014, 12:13  PM

## 2014-05-26 NOTE — Anesthesia Preprocedure Evaluation (Signed)
Anesthesia Evaluation  Patient identified by MRN, date of birth, ID band Patient awake    Reviewed: Allergy & Precautions, H&P , NPO status , Patient's Chart, lab work & pertinent test results  History of Anesthesia Complications Negative for: history of anesthetic complications  Airway Mallampati: II TM Distance: >3 FB Neck ROM: full    Dental no notable dental hx. (+) Teeth Intact   Pulmonary neg pulmonary ROS, asthma ,  breath sounds clear to auscultation  Pulmonary exam normal       Cardiovascular negative cardio ROS  Rhythm:regular Rate:Normal     Neuro/Psych negative neurological ROS  negative psych ROS   GI/Hepatic negative GI ROS, Neg liver ROS,   Endo/Other  negative endocrine ROS  Renal/GU negative Renal ROS  negative genitourinary   Musculoskeletal   Abdominal Normal abdominal exam  (+)   Peds  Hematology negative hematology ROS (+)   Anesthesia Other Findings   Reproductive/Obstetrics (+) Pregnancy                           Anesthesia Physical Anesthesia Plan  ASA: II  Anesthesia Plan: Epidural   Post-op Pain Management:    Induction:   Airway Management Planned:   Additional Equipment:   Intra-op Plan:   Post-operative Plan:   Informed Consent: I have reviewed the patients History and Physical, chart, labs and discussed the procedure including the risks, benefits and alternatives for the proposed anesthesia with the patient or authorized representative who has indicated his/her understanding and acceptance.     Plan Discussed with:   Anesthesia Plan Comments:         Anesthesia Quick Evaluation  

## 2014-05-26 NOTE — MAU Note (Signed)
Pt states she has been having uc's since 0300, unsure if leaking fluid, has been feeling wetness in her underwear, has had slight amount of bloody mucus.

## 2014-05-26 NOTE — Anesthesia Procedure Notes (Signed)
Epidural Patient location during procedure: OB  Staffing Anesthesiologist: Tamryn Popko Performed by: anesthesiologist   Preanesthetic Checklist Completed: patient identified, site marked, surgical consent, pre-op evaluation, timeout performed, IV checked, risks and benefits discussed and monitors and equipment checked  Epidural Patient position: sitting Prep: ChloraPrep Patient monitoring: heart rate, continuous pulse ox and blood pressure Approach: right paramedian Location: L3-L4 Injection technique: LOR saline  Needle:  Needle type: Tuohy  Needle gauge: 17 G Needle length: 9 cm and 9 Needle insertion depth: 5 cm Catheter type: closed end flexible Catheter size: 20 Guage Catheter at skin depth: 10 cm Test dose: negative  Assessment Events: blood not aspirated, injection not painful, no injection resistance, negative IV test and no paresthesia  Additional Notes   Patient tolerated the insertion well without complications.   

## 2014-05-26 NOTE — Progress Notes (Signed)
Dr Billy Coastaavon

## 2014-05-26 NOTE — MAU Provider Note (Signed)
  History     CSN: [id number]  Arrival date and time: 05/26/14 [id number]   None     Chief Complaint  Patient presents with  . Labor Eval   HPI  Pt. Presents to MAU today for a labor check with questionable rupture of membranes.  She noticed having an increase in blood-tinged mucus and watery discharge around 3:00am.  She reports + FM and contractions every 5 minutes prior to coming to the hospital. She reports history of fast labors.    OB History    Gravida Para Term Preterm AB TAB SAB Ectopic Multiple Living   2 1 1       1       Past Medical History  Diagnosis Date  . Asthma   . Postpartum care following vaginal delivery (8/18) 11/20/2012    Past Surgical History  Procedure Laterality Date  . Cholecystectomy N/A 03/16/2013    Procedure: LAPAROSCOPIC CHOLECYSTECTOMY WITH INTRAOPERATIVE CHOLANGIOGRAM;  Surgeon: Velora Hecklerodd M Gerkin, MD;  [facility];  Service: General;  Laterality: N/A;    Family History  Problem Relation Age of Onset  . Cancer Maternal Grandmother     breast    History  Substance Use Topics  . Smoking status: Never Smoker   . Smokeless tobacco: Not on file  . Alcohol Use: No    Allergies: No Known Allergies  Prescriptions prior to admission  Medication Sig Dispense Refill Last Dose  . albuterol (PROVENTIL HFA;VENTOLIN HFA) 108 (90 BASE) MCG/ACT inhaler Inhale 1-2 puffs into the lungs every 6 (six) hours as needed for wheezing or shortness of breath.   rescue  . calcium carbonate (OS-CAL) 1250 MG chewable tablet Chew 1 tablet by mouth daily.   05/25/2014 at Unknown time  . cetirizine (ZYRTEC) 10 MG tablet Take 10 mg by mouth daily.   05/25/2014 at Unknown time  . fluticasone (FLONASE) 50 MCG/ACT nasal spray Place 2 sprays into both nostrils daily.   05/25/2014 at Unknown time  . montelukast (SINGULAIR) 10 MG tablet Take 10 mg by mouth at bedtime.   Past Month at Unknown time  . Prenatal Vit-Fe Fumarate-FA (MULTIVITAMIN-PRENATAL) 27-0.8 MG TABS tablet Take 1  tablet by mouth daily at 12 noon.   05/25/2014 at Unknown time  . therapeutic multivitamin-minerals (THERAGRAN-M) tablet Take 1 tablet by mouth daily.   05/25/2014 at Unknown time  . albuterol (PROVENTIL) (2.5 MG/3ML) 0.083% nebulizer solution Take 2.5 mg by nebulization every 6 (six) hours as needed for wheezing.   rescue    ROS  General: denies fever, chills Uterus: painful contractions every 5 minutes, + FM GU: blood-tinged mucus discharge that "soaked through underwear" Physical Exam   Blood pressure 116/84, pulse 96, temperature 97.9 F (36.6 C), temperature source Oral, resp. rate 18.  Physical Exam  General: alert, oriented, calm Heart:RRR Lungs: clear, equal bilaterally Abdomen: gravid, non-tender Speculum exam: copious thick, white- blood-tinged discharge with clear fluid pooling on speculum Bimanual: 4cm/90%/0 station / vtx.  Extremities: no edema, negative Homan's sign  FHR tracing: FHR 135bmp, + Accels, no decelerations Contractions: 4-637min / moderate    MAU Course  Procedures  Fern Test - positive   Assessment and Plan  A:  [redacted] weeks gestation +ROM - clear fluid  P:  Admit for Active Labor Consulted with Dr. Billy Coastaavon - MD to follow Pitocin 2 milliunits x2 milliunits Epidural upon request  Milinda CaveMeredith Yaris Ferrell, SNM  Milinda CaveCausey, Mannix Kroeker 05/26/2014, 11:38 AM

## 2014-05-26 NOTE — Progress Notes (Signed)
Laura Bruce is a 34 y.o. G2P1001 at 6544w0d by LMP admitted for active labor, rupture of membranes  Subjective: Getting uncomfortable  Objective: BP 109/77 mmHg  Pulse 100  Temp(Src) 97.9 F (36.6 C) (Oral)  Resp 18  Ht 5\' 3"  (1.6 m)  Wt 74.844 kg (165 lb)  BMI 29.24 kg/m2      FHT:  FHR: 145 bpm, variability: moderate,  accelerations:  Present,  decelerations:  Absent UC:   irregular, every 5 minutes SVE:   Dilation: 4.5 Effacement (%): 80, 90 Station: -1 Exam by:: DHerr rn  Labs: Lab Results  Component Value Date   WBC 9.5 05/26/2014   HGB 11.5* 05/26/2014   HCT 34.7* 05/26/2014   MCV 81.5 05/26/2014   PLT 201 05/26/2014    Assessment / Plan: Augmentation of labor, progressing well  Labor: Progressing normally Preeclampsia:  no signs or symptoms of toxicity Fetal Wellbeing:  Category I Pain Control:  Epidural(awaiting) I/D:  n/a Anticipated MOD:  NSVD  Laura Bruce J 05/26/2014, 2:23 PM

## 2014-05-27 ENCOUNTER — Inpatient Hospital Stay (HOSPITAL_COMMUNITY): Admission: RE | Admit: 2014-05-27 | Payer: BLUE CROSS/BLUE SHIELD | Source: Ambulatory Visit

## 2014-05-27 DIAGNOSIS — D649 Anemia, unspecified: Secondary | ICD-10-CM | POA: Diagnosis not present

## 2014-05-27 DIAGNOSIS — O99892 Other specified diseases and conditions complicating childbirth: Secondary | ICD-10-CM

## 2014-05-27 DIAGNOSIS — O9989 Other specified diseases and conditions complicating pregnancy, childbirth and the puerperium: Secondary | ICD-10-CM

## 2014-05-27 DIAGNOSIS — Z2839 Other underimmunization status: Secondary | ICD-10-CM

## 2014-05-27 DIAGNOSIS — Z283 Underimmunization status: Secondary | ICD-10-CM

## 2014-05-27 LAB — CBC
HCT: 32.9 % — ABNORMAL LOW (ref 36.0–46.0)
Hemoglobin: 10.8 g/dL — ABNORMAL LOW (ref 12.0–15.0)
MCH: 27.1 pg (ref 26.0–34.0)
MCHC: 32.8 g/dL (ref 30.0–36.0)
MCV: 82.5 fL (ref 78.0–100.0)
Platelets: 192 10*3/uL (ref 150–400)
RBC: 3.99 MIL/uL (ref 3.87–5.11)
RDW: 14.4 % (ref 11.5–15.5)
WBC: 9.5 10*3/uL (ref 4.0–10.5)

## 2014-05-27 LAB — RPR: RPR Ser Ql: NONREACTIVE

## 2014-05-27 MED ORDER — IBUPROFEN 600 MG PO TABS: 600.0000 mg | ORAL_TABLET | Freq: Four times a day (QID) | ORAL | Status: DC

## 2014-05-27 MED ORDER — MEASLES, MUMPS & RUBELLA VAC ~~LOC~~ INJ
0.5000 mL | INJECTION | Freq: Once | SUBCUTANEOUS | Status: AC
  Administered 2014-05-27: 0.5 mL via SUBCUTANEOUS
  Filled 2014-05-27 (×2): qty 0.5

## 2014-05-27 NOTE — Progress Notes (Signed)
PPD #1- SVD  Subjective:   Reports feeling well, desires early discharge Tolerating po/ No nausea or vomiting Bleeding is light Pain controlled with Motrin Up ad lib / ambulatory / voiding without problems Newborn: breastfeeding     Objective:   VS: VS:  Filed Vitals:   05/26/14 1730 05/26/14 1827 05/26/14 2230 05/27/14 0557  BP: 1$Rem'10/70 98/72 95/55 'hIPZ$ 91/57  Pulse: 73 64 79 69  Temp: 98.2 F (36.8 C) 97.8 F (36.6 C) 98.2 F (36.8 C) 97.8 F (36.6 C)  TempSrc: Oral Oral Axillary Oral  Resp: $Remo'18 18 18 18  'VWJNw$ Height:      Weight:      SpO2:        LABS:  Recent Labs  05/26/14 1245 05/27/14 0550  WBC 9.5 9.5  HGB 11.5* 10.8*  PLT 201 192   Blood type: B/Positive/-- (07/07 0000) Rubella: Equivocal (07/07 0000)                I&O: Intake/Output      02/21 0701 - 02/22 0700 02/22 0701 - 02/23 0700   Urine (mL/kg/hr) 0    Blood 200    Total Output 200     Net -200          Urine Occurrence 1 x      Physical Exam: Alert and oriented X3 Abdomen: soft, non-tender, non-distended  Fundus: firm, non-tender, U-2 Perineum: intact, no edema or erythema Lochia: small Extremities: No edema, no calf pain or tenderness    Assessment: PPD #1  G2P2002/ S/P:spontaneous vaginal Mild anemia Rubella non-immune Doing well - stable for discharge home   Plan: MMR prior to d/c Discharge home RX's:  Ibuprofen $RemoveBe'600mg'npULApxcj$  po Q 6 hrs prn pain #30 Refill x 0 Routine pp visit in [facility] booklet given    Julianne Handler, N MSN, CNM 05/27/2014, 1:16 PM

## 2014-05-27 NOTE — Lactation Note (Signed)
This note was copied from the chart of Girl Neysa HotterRachel Renk. Lactation Consultation Note  Patient Name: Girl Neysa HotterRachel Canizales WUJWJ'XToday's Date: 05/27/2014 Reason for consult: Initial assessment (updated doc flow sheets per mom and dad )  Baby is 719 hours old , has been to the breast several times , voids and stools qs.  Per mom baby is latching well and the latch is comfortable. Per mom with 1st baby had no problems with breast feeding. Also has a DEBP at home  [facility] discussed basics of latching and encouraged mom to call for assessment. Sore nipple and engorgement prevention reviewed .  Mother informed of post-discharge support and given phone number to the lactation department, including services for phone call  assistance; out-patient appointments; and breastfeeding support group. List of other breastfeeding resources in the community given  in the handout. Encouraged mother to call for problems or concerns related to breastfeeding.   Maternal Data Does the patient have breastfeeding experience prior to this delivery?: Yes  Feeding Feeding Type:  (per mom has fed recebtly , baby sound asleep ) Length of feed: 15 min (per mom )  LATCH Score/Interventions                Intervention(s): Breastfeeding basics reviewed     Lactation Tools Discussed/Used WIC Program: No   Consult Status Consult Status: Follow-up (mom to page for a latch assessment ) Date: 05/27/14 Follow-up type: In-patient    Kathrin Greathouseorio, Janee Ureste Ann 05/27/2014, 11:13 AM

## 2014-05-27 NOTE — Discharge Summary (Signed)
Obstetric Discharge Summary Reason for Admission: onset of labor and spontaneous abortion Prenatal Course/Complications: uncomplicated Intrapartum Procedures: spontaneous vaginal delivery Postpartum Procedures: Rubella Ig Postpartum Complications: none  Physical Exam:  General: alert and cooperative Lochia: appropriate Uterine Fundus: firm Incision: none DVT Evaluation: No evidence of DVT seen on physical exam. Negative Homan's sign. No cords or calf tenderness. No significant calf/ankle edema.  Discharge Diagnoses: Term Pregnancy-delivered and mild anemia  Discharge Information: Date:  Activity: pelvic rest Diet: routine Medications: PNV and Ibuprofen Condition: stable Instructions: refer to practice specific booklet Discharge to: home   Newborn Data: Live born female on 05/26/14 Birth Weight: 7-9 APGAR: 9/9  Home with mother.  Donette LarryBHAMBRI, Marilena Trevathan, N MSN, CNM 05/27/2014, 3:06 PM

## 2014-05-27 NOTE — Anesthesia Postprocedure Evaluation (Signed)
  Anesthesia Post-op Note  Patient: Laura CitronRachel N Conerly  Procedure(s) Performed: * No procedures listed *  Patient Location: Mother/Baby  Anesthesia Type:Epidural  Level of Consciousness: awake, alert , oriented and patient cooperative  Airway and Oxygen Therapy: Patient Spontanous Breathing  Post-op Pain: none  Post-op Assessment: Post-op Vital signs reviewed, Patient's Cardiovascular Status Stable, Respiratory Function Stable, Patent Airway, No headache, No backache, No residual numbness and No residual motor weakness  Post-op Vital Signs: Reviewed and stable  Last Vitals:  Filed Vitals:   05/27/14 0557  BP: 91/57  Pulse: 69  Temp: 36.6 C  Resp: 18    Complications: No apparent anesthesia complications

## 2014-05-30 ENCOUNTER — Encounter (HOSPITAL_COMMUNITY): Payer: Self-pay | Admitting: *Deleted

## 2014-05-30 ENCOUNTER — Inpatient Hospital Stay (HOSPITAL_COMMUNITY): Admission: RE | Admit: 2014-05-30 | Payer: BLUE CROSS/BLUE SHIELD | Source: Ambulatory Visit

## 2014-05-31 ENCOUNTER — Inpatient Hospital Stay (HOSPITAL_COMMUNITY): Admission: RE | Admit: 2014-05-31 | Payer: BLUE CROSS/BLUE SHIELD | Source: Ambulatory Visit

## 2014-11-04 IMAGING — RF DG CHOLANGIOGRAM OPERATIVE
1 series · 4 of 4 positions shown · non-contrast
Comparison: None.

CLINICAL DATA: Cholelithiasis

EXAM:
INTRAOPERATIVE CHOLANGIOGRAM
TECHNIQUE: Cholangiographic images from the C-arm fluoroscopic device were
submitted for interpretation post-operatively. Please see the
procedural report for the amount of contrast and the fluoroscopy
time utilized.

[Series 1: run · 4 of 61 frames shown]
[frame 10/61]
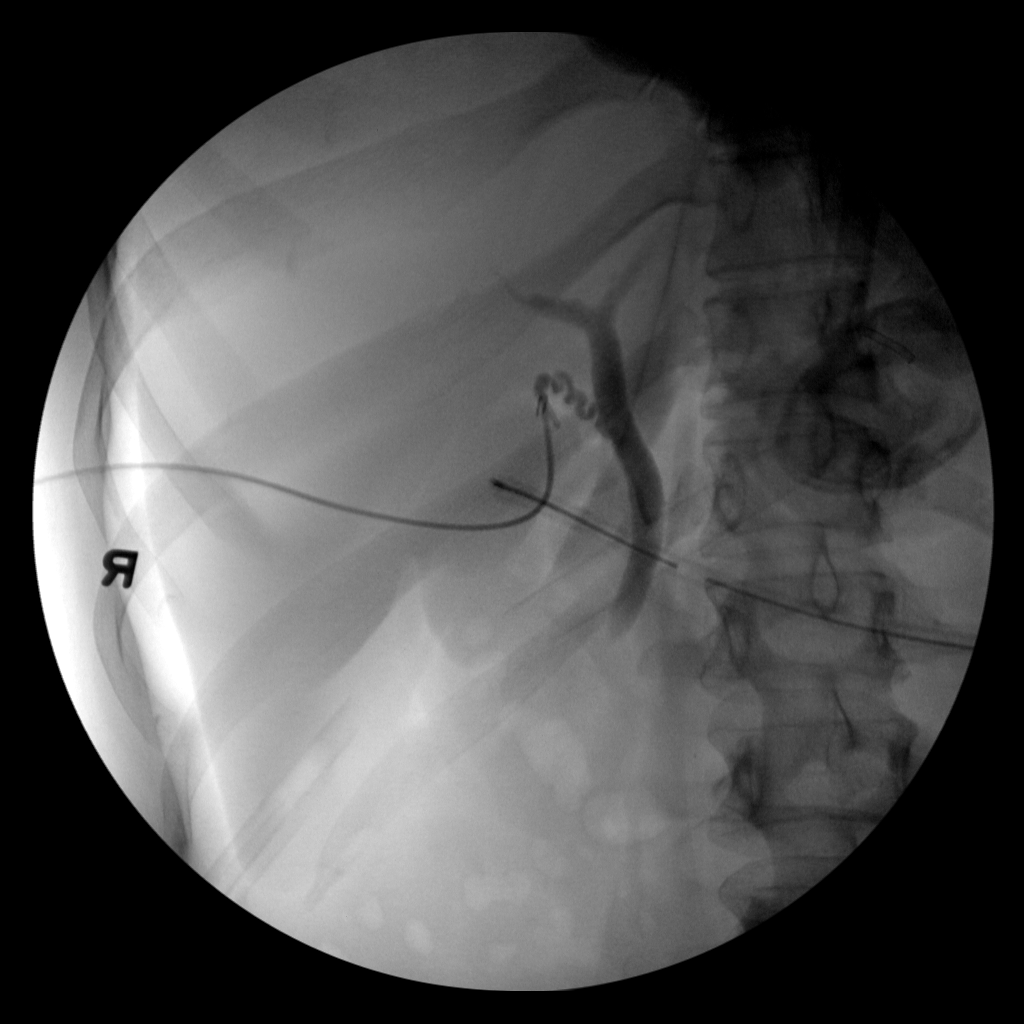
[frame 29/61]
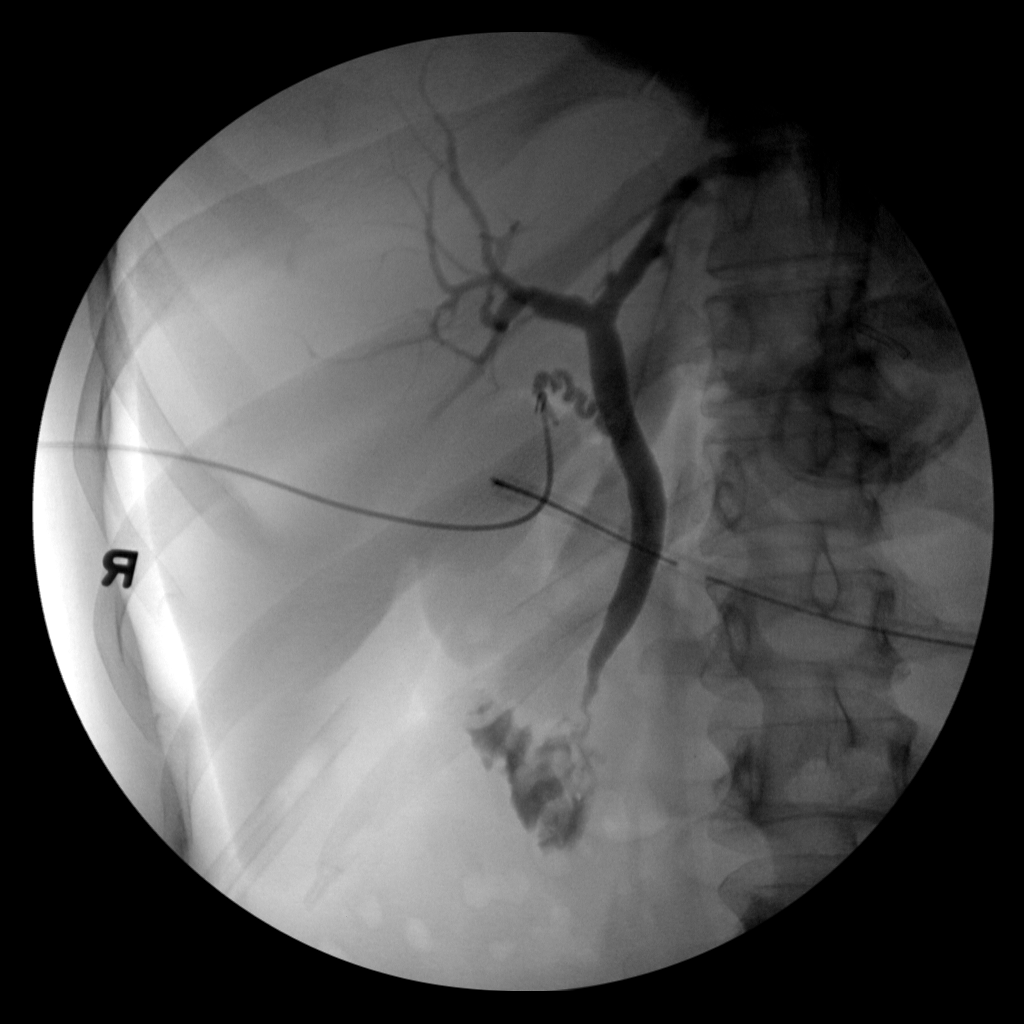
[frame 31/61]
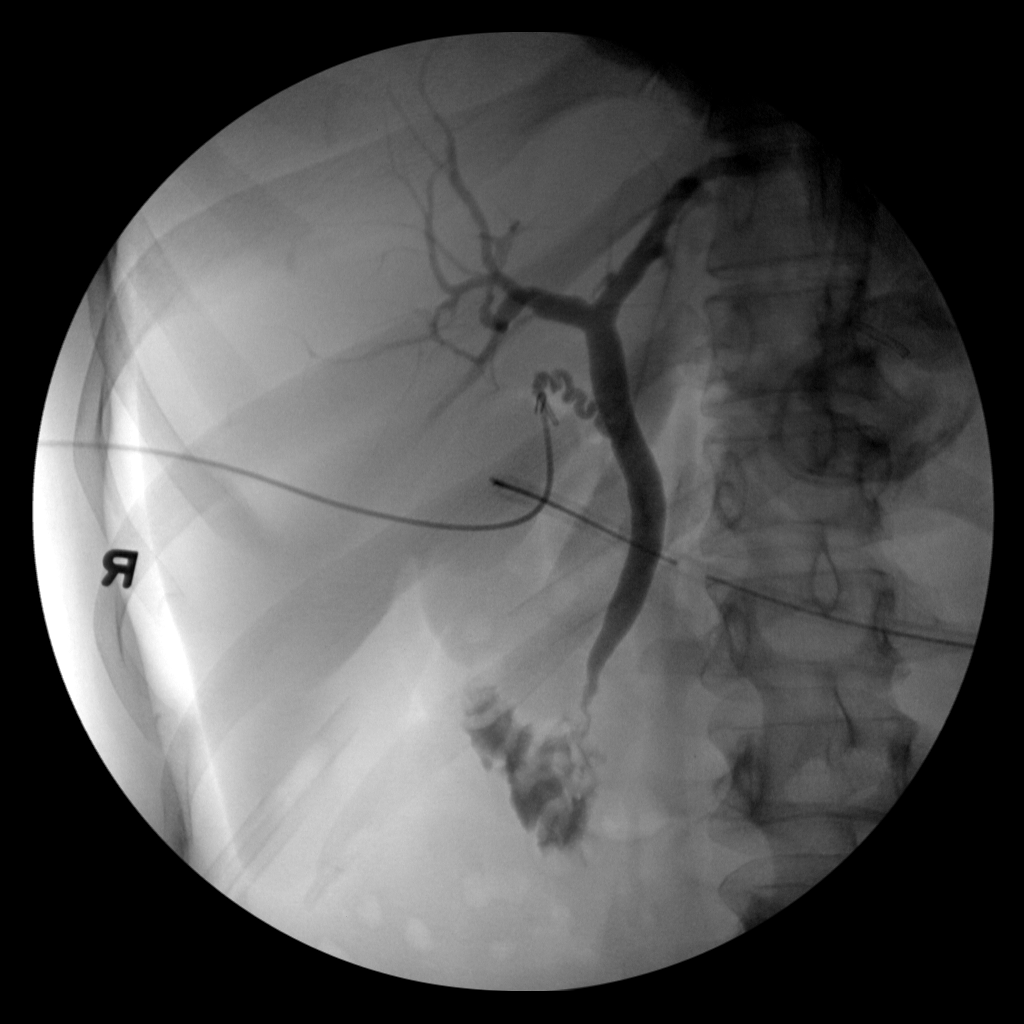
[frame 52/61]
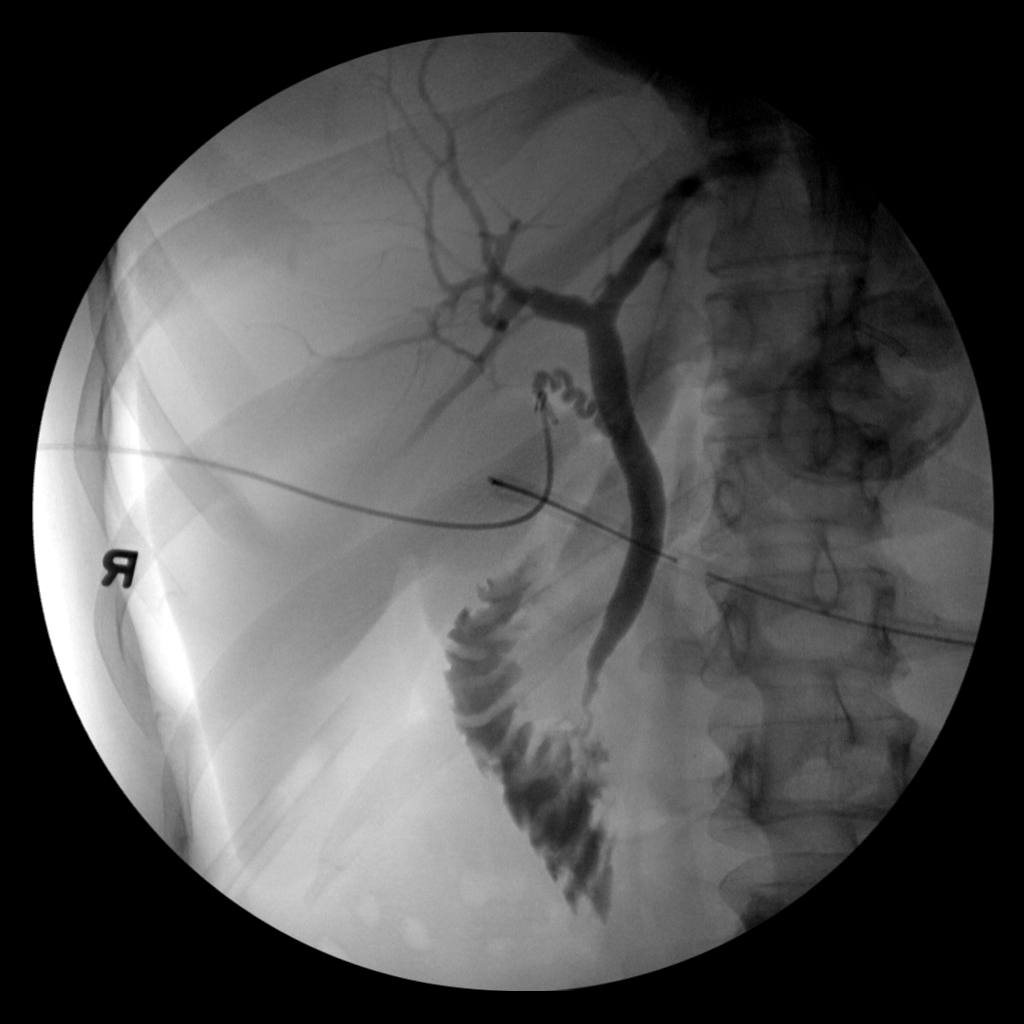

[4 of 4 positions shown; findings below may reference images not displayed]

FINDINGS: The gallbladder has been removed, and the cystic duct has been
cannulated. The intrahepatic and extrahepatic biliary ducts appear
normal. There is no apparent mass or calculus. There is apparent
free flow of contrast via the common bile duct into the duodenum.
IMPRESSION: No abnormality noted.

## 2015-08-29 DIAGNOSIS — Z01419 Encounter for gynecological examination (general) (routine) without abnormal findings: Secondary | ICD-10-CM | POA: Diagnosis not present

## 2015-08-29 DIAGNOSIS — Z6829 Body mass index (BMI) 29.0-29.9, adult: Secondary | ICD-10-CM | POA: Diagnosis not present

## 2015-08-29 DIAGNOSIS — Z1151 Encounter for screening for human papillomavirus (HPV): Secondary | ICD-10-CM | POA: Diagnosis not present

## 2015-11-10 DIAGNOSIS — J020 Streptococcal pharyngitis: Secondary | ICD-10-CM | POA: Diagnosis not present

## 2015-12-16 DIAGNOSIS — J029 Acute pharyngitis, unspecified: Secondary | ICD-10-CM | POA: Diagnosis not present

## 2016-01-27 DIAGNOSIS — M62830 Muscle spasm of back: Secondary | ICD-10-CM | POA: Diagnosis not present

## 2016-01-27 DIAGNOSIS — M9904 Segmental and somatic dysfunction of sacral region: Secondary | ICD-10-CM | POA: Diagnosis not present

## 2016-01-27 DIAGNOSIS — M9905 Segmental and somatic dysfunction of pelvic region: Secondary | ICD-10-CM | POA: Diagnosis not present

## 2016-01-27 DIAGNOSIS — M9903 Segmental and somatic dysfunction of lumbar region: Secondary | ICD-10-CM | POA: Diagnosis not present

## 2016-02-14 DIAGNOSIS — J358 Other chronic diseases of tonsils and adenoids: Secondary | ICD-10-CM | POA: Diagnosis not present

## 2016-02-14 DIAGNOSIS — J029 Acute pharyngitis, unspecified: Secondary | ICD-10-CM | POA: Diagnosis not present

## 2016-02-18 DIAGNOSIS — M9903 Segmental and somatic dysfunction of lumbar region: Secondary | ICD-10-CM | POA: Diagnosis not present

## 2016-02-18 DIAGNOSIS — M9905 Segmental and somatic dysfunction of pelvic region: Secondary | ICD-10-CM | POA: Diagnosis not present

## 2016-02-18 DIAGNOSIS — M62830 Muscle spasm of back: Secondary | ICD-10-CM | POA: Diagnosis not present

## 2016-02-18 DIAGNOSIS — M9904 Segmental and somatic dysfunction of sacral region: Secondary | ICD-10-CM | POA: Diagnosis not present

## 2016-03-15 DIAGNOSIS — M62830 Muscle spasm of back: Secondary | ICD-10-CM | POA: Diagnosis not present

## 2016-03-15 DIAGNOSIS — M9905 Segmental and somatic dysfunction of pelvic region: Secondary | ICD-10-CM | POA: Diagnosis not present

## 2016-03-15 DIAGNOSIS — M9903 Segmental and somatic dysfunction of lumbar region: Secondary | ICD-10-CM | POA: Diagnosis not present

## 2016-03-15 DIAGNOSIS — M9904 Segmental and somatic dysfunction of sacral region: Secondary | ICD-10-CM | POA: Diagnosis not present

## 2016-04-06 DIAGNOSIS — M9903 Segmental and somatic dysfunction of lumbar region: Secondary | ICD-10-CM | POA: Diagnosis not present

## 2016-04-06 DIAGNOSIS — M9905 Segmental and somatic dysfunction of pelvic region: Secondary | ICD-10-CM | POA: Diagnosis not present

## 2016-04-06 DIAGNOSIS — M62830 Muscle spasm of back: Secondary | ICD-10-CM | POA: Diagnosis not present

## 2016-04-06 DIAGNOSIS — M9904 Segmental and somatic dysfunction of sacral region: Secondary | ICD-10-CM | POA: Diagnosis not present

## 2016-05-03 DIAGNOSIS — M9905 Segmental and somatic dysfunction of pelvic region: Secondary | ICD-10-CM | POA: Diagnosis not present

## 2016-05-03 DIAGNOSIS — M9903 Segmental and somatic dysfunction of lumbar region: Secondary | ICD-10-CM | POA: Diagnosis not present

## 2016-05-03 DIAGNOSIS — M9904 Segmental and somatic dysfunction of sacral region: Secondary | ICD-10-CM | POA: Diagnosis not present

## 2016-05-03 DIAGNOSIS — M62830 Muscle spasm of back: Secondary | ICD-10-CM | POA: Diagnosis not present

## 2016-05-04 DIAGNOSIS — Z0000 Encounter for general adult medical examination without abnormal findings: Secondary | ICD-10-CM | POA: Diagnosis not present

## 2016-05-04 DIAGNOSIS — Z23 Encounter for immunization: Secondary | ICD-10-CM | POA: Diagnosis not present

## 2016-05-31 DIAGNOSIS — M9904 Segmental and somatic dysfunction of sacral region: Secondary | ICD-10-CM | POA: Diagnosis not present

## 2016-05-31 DIAGNOSIS — M62830 Muscle spasm of back: Secondary | ICD-10-CM | POA: Diagnosis not present

## 2016-05-31 DIAGNOSIS — M9903 Segmental and somatic dysfunction of lumbar region: Secondary | ICD-10-CM | POA: Diagnosis not present

## 2016-05-31 DIAGNOSIS — M9905 Segmental and somatic dysfunction of pelvic region: Secondary | ICD-10-CM | POA: Diagnosis not present

## 2016-06-28 DIAGNOSIS — M9904 Segmental and somatic dysfunction of sacral region: Secondary | ICD-10-CM | POA: Diagnosis not present

## 2016-06-28 DIAGNOSIS — M62830 Muscle spasm of back: Secondary | ICD-10-CM | POA: Diagnosis not present

## 2016-06-28 DIAGNOSIS — M9903 Segmental and somatic dysfunction of lumbar region: Secondary | ICD-10-CM | POA: Diagnosis not present

## 2016-06-28 DIAGNOSIS — M9905 Segmental and somatic dysfunction of pelvic region: Secondary | ICD-10-CM | POA: Diagnosis not present

## 2016-07-20 DIAGNOSIS — Z01419 Encounter for gynecological examination (general) (routine) without abnormal findings: Secondary | ICD-10-CM | POA: Diagnosis not present

## 2016-07-20 DIAGNOSIS — Z6829 Body mass index (BMI) 29.0-29.9, adult: Secondary | ICD-10-CM | POA: Diagnosis not present

## 2016-07-26 DIAGNOSIS — M9904 Segmental and somatic dysfunction of sacral region: Secondary | ICD-10-CM | POA: Diagnosis not present

## 2016-07-26 DIAGNOSIS — M9905 Segmental and somatic dysfunction of pelvic region: Secondary | ICD-10-CM | POA: Diagnosis not present

## 2016-07-26 DIAGNOSIS — M9903 Segmental and somatic dysfunction of lumbar region: Secondary | ICD-10-CM | POA: Diagnosis not present

## 2016-07-26 DIAGNOSIS — M62830 Muscle spasm of back: Secondary | ICD-10-CM | POA: Diagnosis not present

## 2016-08-23 DIAGNOSIS — M9903 Segmental and somatic dysfunction of lumbar region: Secondary | ICD-10-CM | POA: Diagnosis not present

## 2016-08-23 DIAGNOSIS — M9904 Segmental and somatic dysfunction of sacral region: Secondary | ICD-10-CM | POA: Diagnosis not present

## 2016-08-23 DIAGNOSIS — M9905 Segmental and somatic dysfunction of pelvic region: Secondary | ICD-10-CM | POA: Diagnosis not present

## 2016-08-23 DIAGNOSIS — M62830 Muscle spasm of back: Secondary | ICD-10-CM | POA: Diagnosis not present

## 2016-09-20 DIAGNOSIS — M9904 Segmental and somatic dysfunction of sacral region: Secondary | ICD-10-CM | POA: Diagnosis not present

## 2016-09-20 DIAGNOSIS — M62830 Muscle spasm of back: Secondary | ICD-10-CM | POA: Diagnosis not present

## 2016-09-20 DIAGNOSIS — M9903 Segmental and somatic dysfunction of lumbar region: Secondary | ICD-10-CM | POA: Diagnosis not present

## 2016-09-20 DIAGNOSIS — M9905 Segmental and somatic dysfunction of pelvic region: Secondary | ICD-10-CM | POA: Diagnosis not present

## 2016-10-18 DIAGNOSIS — M9903 Segmental and somatic dysfunction of lumbar region: Secondary | ICD-10-CM | POA: Diagnosis not present

## 2016-10-18 DIAGNOSIS — M62830 Muscle spasm of back: Secondary | ICD-10-CM | POA: Diagnosis not present

## 2016-10-18 DIAGNOSIS — M9905 Segmental and somatic dysfunction of pelvic region: Secondary | ICD-10-CM | POA: Diagnosis not present

## 2016-10-18 DIAGNOSIS — M9904 Segmental and somatic dysfunction of sacral region: Secondary | ICD-10-CM | POA: Diagnosis not present

## 2016-11-22 DIAGNOSIS — M9905 Segmental and somatic dysfunction of pelvic region: Secondary | ICD-10-CM | POA: Diagnosis not present

## 2016-11-22 DIAGNOSIS — M9903 Segmental and somatic dysfunction of lumbar region: Secondary | ICD-10-CM | POA: Diagnosis not present

## 2016-11-22 DIAGNOSIS — M9904 Segmental and somatic dysfunction of sacral region: Secondary | ICD-10-CM | POA: Diagnosis not present

## 2016-11-22 DIAGNOSIS — M62830 Muscle spasm of back: Secondary | ICD-10-CM | POA: Diagnosis not present

## 2016-12-21 DIAGNOSIS — M62830 Muscle spasm of back: Secondary | ICD-10-CM | POA: Diagnosis not present

## 2016-12-21 DIAGNOSIS — M9903 Segmental and somatic dysfunction of lumbar region: Secondary | ICD-10-CM | POA: Diagnosis not present

## 2016-12-21 DIAGNOSIS — M9904 Segmental and somatic dysfunction of sacral region: Secondary | ICD-10-CM | POA: Diagnosis not present

## 2016-12-21 DIAGNOSIS — M9905 Segmental and somatic dysfunction of pelvic region: Secondary | ICD-10-CM | POA: Diagnosis not present

## 2017-01-12 DIAGNOSIS — Z3201 Encounter for pregnancy test, result positive: Secondary | ICD-10-CM | POA: Diagnosis not present

## 2017-01-17 DIAGNOSIS — M62830 Muscle spasm of back: Secondary | ICD-10-CM | POA: Diagnosis not present

## 2017-01-17 DIAGNOSIS — M9903 Segmental and somatic dysfunction of lumbar region: Secondary | ICD-10-CM | POA: Diagnosis not present

## 2017-01-17 DIAGNOSIS — M9905 Segmental and somatic dysfunction of pelvic region: Secondary | ICD-10-CM | POA: Diagnosis not present

## 2017-01-17 DIAGNOSIS — M9904 Segmental and somatic dysfunction of sacral region: Secondary | ICD-10-CM | POA: Diagnosis not present

## 2017-02-10 DIAGNOSIS — Z3201 Encounter for pregnancy test, result positive: Secondary | ICD-10-CM | POA: Diagnosis not present

## 2017-02-17 DIAGNOSIS — Z3A09 9 weeks gestation of pregnancy: Secondary | ICD-10-CM | POA: Diagnosis not present

## 2017-02-17 DIAGNOSIS — Z3689 Encounter for other specified antenatal screening: Secondary | ICD-10-CM | POA: Diagnosis not present

## 2017-02-17 DIAGNOSIS — Z23 Encounter for immunization: Secondary | ICD-10-CM | POA: Diagnosis not present

## 2017-02-17 DIAGNOSIS — O09521 Supervision of elderly multigravida, first trimester: Secondary | ICD-10-CM | POA: Diagnosis not present

## 2017-02-17 LAB — OB RESULTS CONSOLE ABO/RH: RH Type: POSITIVE

## 2017-02-17 LAB — OB RESULTS CONSOLE HEPATITIS B SURFACE ANTIGEN: Hepatitis B Surface Ag: NEGATIVE

## 2017-02-17 LAB — OB RESULTS CONSOLE RPR: RPR: NONREACTIVE

## 2017-02-17 LAB — OB RESULTS CONSOLE HIV ANTIBODY (ROUTINE TESTING): HIV: NONREACTIVE

## 2017-02-17 LAB — OB RESULTS CONSOLE RUBELLA ANTIBODY, IGM: Rubella: IMMUNE

## 2017-02-17 LAB — OB RESULTS CONSOLE ANTIBODY SCREEN: Antibody Screen: NEGATIVE

## 2017-02-21 DIAGNOSIS — M9905 Segmental and somatic dysfunction of pelvic region: Secondary | ICD-10-CM | POA: Diagnosis not present

## 2017-02-21 DIAGNOSIS — M9903 Segmental and somatic dysfunction of lumbar region: Secondary | ICD-10-CM | POA: Diagnosis not present

## 2017-02-21 DIAGNOSIS — M62830 Muscle spasm of back: Secondary | ICD-10-CM | POA: Diagnosis not present

## 2017-02-21 DIAGNOSIS — M9904 Segmental and somatic dysfunction of sacral region: Secondary | ICD-10-CM | POA: Diagnosis not present

## 2017-02-23 DIAGNOSIS — O09521 Supervision of elderly multigravida, first trimester: Secondary | ICD-10-CM | POA: Diagnosis not present

## 2017-02-23 DIAGNOSIS — Z3A10 10 weeks gestation of pregnancy: Secondary | ICD-10-CM | POA: Diagnosis not present

## 2017-02-23 DIAGNOSIS — Z3689 Encounter for other specified antenatal screening: Secondary | ICD-10-CM | POA: Diagnosis not present

## 2017-02-23 LAB — OB RESULTS CONSOLE GC/CHLAMYDIA
Chlamydia: NEGATIVE
Gonorrhea: NEGATIVE

## 2017-03-16 DIAGNOSIS — O09521 Supervision of elderly multigravida, first trimester: Secondary | ICD-10-CM | POA: Diagnosis not present

## 2017-03-16 DIAGNOSIS — Z3A13 13 weeks gestation of pregnancy: Secondary | ICD-10-CM | POA: Diagnosis not present

## 2017-03-22 DIAGNOSIS — M9903 Segmental and somatic dysfunction of lumbar region: Secondary | ICD-10-CM | POA: Diagnosis not present

## 2017-03-22 DIAGNOSIS — M62830 Muscle spasm of back: Secondary | ICD-10-CM | POA: Diagnosis not present

## 2017-03-22 DIAGNOSIS — M9904 Segmental and somatic dysfunction of sacral region: Secondary | ICD-10-CM | POA: Diagnosis not present

## 2017-03-22 DIAGNOSIS — M9905 Segmental and somatic dysfunction of pelvic region: Secondary | ICD-10-CM | POA: Diagnosis not present

## 2017-04-05 NOTE — L&D Delivery Note (Signed)
Delivery Note At 12:02 PM a viable and healthy female was delivered via Vaginal, Spontaneous (Presentation:LOA  ).  APGAR: 8, 10; weight  pending.   Placenta status: spontaneous, intact.  Cord:  with the following complications: none.  Cord pH: na  Anesthesia:  epidural Episiotomy: None Lacerations: None Suture Repair: na Est. Blood Loss (mL): 100  Mom to postpartum.  Baby to Couplet care / Skin to Skin.  Laura Bruce 09/23/2017, 12:27 PM

## 2017-04-06 DIAGNOSIS — Z3A16 16 weeks gestation of pregnancy: Secondary | ICD-10-CM | POA: Diagnosis not present

## 2017-04-06 DIAGNOSIS — O09522 Supervision of elderly multigravida, second trimester: Secondary | ICD-10-CM | POA: Diagnosis not present

## 2017-04-06 DIAGNOSIS — Z361 Encounter for antenatal screening for raised alphafetoprotein level: Secondary | ICD-10-CM | POA: Diagnosis not present

## 2017-04-11 DIAGNOSIS — M9904 Segmental and somatic dysfunction of sacral region: Secondary | ICD-10-CM | POA: Diagnosis not present

## 2017-04-11 DIAGNOSIS — M62830 Muscle spasm of back: Secondary | ICD-10-CM | POA: Diagnosis not present

## 2017-04-11 DIAGNOSIS — M9905 Segmental and somatic dysfunction of pelvic region: Secondary | ICD-10-CM | POA: Diagnosis not present

## 2017-04-11 DIAGNOSIS — M9903 Segmental and somatic dysfunction of lumbar region: Secondary | ICD-10-CM | POA: Diagnosis not present

## 2017-04-26 DIAGNOSIS — Z3A19 19 weeks gestation of pregnancy: Secondary | ICD-10-CM | POA: Diagnosis not present

## 2017-04-26 DIAGNOSIS — O09522 Supervision of elderly multigravida, second trimester: Secondary | ICD-10-CM | POA: Diagnosis not present

## 2017-05-05 DIAGNOSIS — J029 Acute pharyngitis, unspecified: Secondary | ICD-10-CM | POA: Diagnosis not present

## 2017-05-05 DIAGNOSIS — R6889 Other general symptoms and signs: Secondary | ICD-10-CM | POA: Diagnosis not present

## 2017-05-05 DIAGNOSIS — J069 Acute upper respiratory infection, unspecified: Secondary | ICD-10-CM | POA: Diagnosis not present

## 2017-05-08 DIAGNOSIS — J029 Acute pharyngitis, unspecified: Secondary | ICD-10-CM | POA: Diagnosis not present

## 2017-05-08 DIAGNOSIS — Z3A20 20 weeks gestation of pregnancy: Secondary | ICD-10-CM | POA: Diagnosis not present

## 2017-05-08 DIAGNOSIS — J069 Acute upper respiratory infection, unspecified: Secondary | ICD-10-CM | POA: Diagnosis not present

## 2017-05-09 DIAGNOSIS — O09522 Supervision of elderly multigravida, second trimester: Secondary | ICD-10-CM | POA: Diagnosis not present

## 2017-05-09 DIAGNOSIS — Z3A20 20 weeks gestation of pregnancy: Secondary | ICD-10-CM | POA: Diagnosis not present

## 2017-05-09 DIAGNOSIS — Z362 Encounter for other antenatal screening follow-up: Secondary | ICD-10-CM | POA: Diagnosis not present

## 2017-05-11 DIAGNOSIS — Z0000 Encounter for general adult medical examination without abnormal findings: Secondary | ICD-10-CM | POA: Diagnosis not present

## 2017-05-18 DIAGNOSIS — M62830 Muscle spasm of back: Secondary | ICD-10-CM | POA: Diagnosis not present

## 2017-05-18 DIAGNOSIS — M9903 Segmental and somatic dysfunction of lumbar region: Secondary | ICD-10-CM | POA: Diagnosis not present

## 2017-05-18 DIAGNOSIS — M9905 Segmental and somatic dysfunction of pelvic region: Secondary | ICD-10-CM | POA: Diagnosis not present

## 2017-05-18 DIAGNOSIS — M9904 Segmental and somatic dysfunction of sacral region: Secondary | ICD-10-CM | POA: Diagnosis not present

## 2017-06-06 DIAGNOSIS — M62830 Muscle spasm of back: Secondary | ICD-10-CM | POA: Diagnosis not present

## 2017-06-06 DIAGNOSIS — M9905 Segmental and somatic dysfunction of pelvic region: Secondary | ICD-10-CM | POA: Diagnosis not present

## 2017-06-06 DIAGNOSIS — M9904 Segmental and somatic dysfunction of sacral region: Secondary | ICD-10-CM | POA: Diagnosis not present

## 2017-06-06 DIAGNOSIS — M9903 Segmental and somatic dysfunction of lumbar region: Secondary | ICD-10-CM | POA: Diagnosis not present

## 2017-06-08 DIAGNOSIS — Z3A25 25 weeks gestation of pregnancy: Secondary | ICD-10-CM | POA: Diagnosis not present

## 2017-06-08 DIAGNOSIS — O09522 Supervision of elderly multigravida, second trimester: Secondary | ICD-10-CM | POA: Diagnosis not present

## 2017-06-28 DIAGNOSIS — Z3A28 28 weeks gestation of pregnancy: Secondary | ICD-10-CM | POA: Diagnosis not present

## 2017-06-28 DIAGNOSIS — O09523 Supervision of elderly multigravida, third trimester: Secondary | ICD-10-CM | POA: Diagnosis not present

## 2017-06-28 DIAGNOSIS — Z23 Encounter for immunization: Secondary | ICD-10-CM | POA: Diagnosis not present

## 2017-06-28 DIAGNOSIS — Z3689 Encounter for other specified antenatal screening: Secondary | ICD-10-CM | POA: Diagnosis not present

## 2017-07-04 DIAGNOSIS — M9904 Segmental and somatic dysfunction of sacral region: Secondary | ICD-10-CM | POA: Diagnosis not present

## 2017-07-04 DIAGNOSIS — M9903 Segmental and somatic dysfunction of lumbar region: Secondary | ICD-10-CM | POA: Diagnosis not present

## 2017-07-04 DIAGNOSIS — M62830 Muscle spasm of back: Secondary | ICD-10-CM | POA: Diagnosis not present

## 2017-07-04 DIAGNOSIS — M9905 Segmental and somatic dysfunction of pelvic region: Secondary | ICD-10-CM | POA: Diagnosis not present

## 2017-07-13 DIAGNOSIS — O09523 Supervision of elderly multigravida, third trimester: Secondary | ICD-10-CM | POA: Diagnosis not present

## 2017-07-13 DIAGNOSIS — Z3A30 30 weeks gestation of pregnancy: Secondary | ICD-10-CM | POA: Diagnosis not present

## 2017-07-27 DIAGNOSIS — Z3A32 32 weeks gestation of pregnancy: Secondary | ICD-10-CM | POA: Diagnosis not present

## 2017-07-27 DIAGNOSIS — O09523 Supervision of elderly multigravida, third trimester: Secondary | ICD-10-CM | POA: Diagnosis not present

## 2017-08-01 DIAGNOSIS — M9904 Segmental and somatic dysfunction of sacral region: Secondary | ICD-10-CM | POA: Diagnosis not present

## 2017-08-01 DIAGNOSIS — M9903 Segmental and somatic dysfunction of lumbar region: Secondary | ICD-10-CM | POA: Diagnosis not present

## 2017-08-01 DIAGNOSIS — M9905 Segmental and somatic dysfunction of pelvic region: Secondary | ICD-10-CM | POA: Diagnosis not present

## 2017-08-01 DIAGNOSIS — M62830 Muscle spasm of back: Secondary | ICD-10-CM | POA: Diagnosis not present

## 2017-08-11 DIAGNOSIS — Z3A34 34 weeks gestation of pregnancy: Secondary | ICD-10-CM | POA: Diagnosis not present

## 2017-08-11 DIAGNOSIS — O09523 Supervision of elderly multigravida, third trimester: Secondary | ICD-10-CM | POA: Diagnosis not present

## 2017-08-17 DIAGNOSIS — Z3685 Encounter for antenatal screening for Streptococcus B: Secondary | ICD-10-CM | POA: Diagnosis not present

## 2017-08-17 DIAGNOSIS — Z3A35 35 weeks gestation of pregnancy: Secondary | ICD-10-CM | POA: Diagnosis not present

## 2017-08-17 DIAGNOSIS — O09523 Supervision of elderly multigravida, third trimester: Secondary | ICD-10-CM | POA: Diagnosis not present

## 2017-08-30 DIAGNOSIS — M9903 Segmental and somatic dysfunction of lumbar region: Secondary | ICD-10-CM | POA: Diagnosis not present

## 2017-08-30 DIAGNOSIS — M9905 Segmental and somatic dysfunction of pelvic region: Secondary | ICD-10-CM | POA: Diagnosis not present

## 2017-08-30 DIAGNOSIS — M9904 Segmental and somatic dysfunction of sacral region: Secondary | ICD-10-CM | POA: Diagnosis not present

## 2017-08-30 DIAGNOSIS — M62830 Muscle spasm of back: Secondary | ICD-10-CM | POA: Diagnosis not present

## 2017-08-31 DIAGNOSIS — O09523 Supervision of elderly multigravida, third trimester: Secondary | ICD-10-CM | POA: Diagnosis not present

## 2017-08-31 DIAGNOSIS — Z3A37 37 weeks gestation of pregnancy: Secondary | ICD-10-CM | POA: Diagnosis not present

## 2017-08-31 LAB — OB RESULTS CONSOLE GBS: GBS: NEGATIVE

## 2017-09-08 DIAGNOSIS — O09523 Supervision of elderly multigravida, third trimester: Secondary | ICD-10-CM | POA: Diagnosis not present

## 2017-09-08 DIAGNOSIS — Z3A38 38 weeks gestation of pregnancy: Secondary | ICD-10-CM | POA: Diagnosis not present

## 2017-09-12 DIAGNOSIS — M62830 Muscle spasm of back: Secondary | ICD-10-CM | POA: Diagnosis not present

## 2017-09-12 DIAGNOSIS — M9903 Segmental and somatic dysfunction of lumbar region: Secondary | ICD-10-CM | POA: Diagnosis not present

## 2017-09-12 DIAGNOSIS — M9905 Segmental and somatic dysfunction of pelvic region: Secondary | ICD-10-CM | POA: Diagnosis not present

## 2017-09-12 DIAGNOSIS — M9904 Segmental and somatic dysfunction of sacral region: Secondary | ICD-10-CM | POA: Diagnosis not present

## 2017-09-13 ENCOUNTER — Telehealth (HOSPITAL_COMMUNITY): Payer: Self-pay | Admitting: *Deleted

## 2017-09-13 ENCOUNTER — Encounter (HOSPITAL_COMMUNITY): Payer: Self-pay | Admitting: *Deleted

## 2017-09-13 DIAGNOSIS — Z3A39 39 weeks gestation of pregnancy: Secondary | ICD-10-CM | POA: Diagnosis not present

## 2017-09-13 DIAGNOSIS — O09523 Supervision of elderly multigravida, third trimester: Secondary | ICD-10-CM | POA: Diagnosis not present

## 2017-09-13 NOTE — Telephone Encounter (Signed)
Preadmission screen  

## 2017-09-20 ENCOUNTER — Inpatient Hospital Stay (HOSPITAL_COMMUNITY)
Admission: AD | Admit: 2017-09-20 | Payer: BLUE CROSS/BLUE SHIELD | Source: Ambulatory Visit | Admitting: Obstetrics and Gynecology

## 2017-09-21 ENCOUNTER — Other Ambulatory Visit: Payer: Self-pay | Admitting: Obstetrics and Gynecology

## 2017-09-21 DIAGNOSIS — O09523 Supervision of elderly multigravida, third trimester: Secondary | ICD-10-CM | POA: Diagnosis not present

## 2017-09-21 DIAGNOSIS — Z3A40 40 weeks gestation of pregnancy: Secondary | ICD-10-CM | POA: Diagnosis not present

## 2017-09-23 ENCOUNTER — Telehealth (HOSPITAL_COMMUNITY): Payer: Self-pay | Admitting: *Deleted

## 2017-09-23 ENCOUNTER — Encounter (HOSPITAL_COMMUNITY): Payer: Self-pay

## 2017-09-23 ENCOUNTER — Inpatient Hospital Stay (HOSPITAL_COMMUNITY)
Admission: RE | Admit: 2017-09-23 | Discharge: 2017-09-24 | DRG: 807 | Disposition: A | Discharge status: Home / Self Care | Payer: BLUE CROSS/BLUE SHIELD | Attending: Obstetrics and Gynecology | Admitting: Obstetrics and Gynecology

## 2017-09-23 ENCOUNTER — Inpatient Hospital Stay (HOSPITAL_COMMUNITY): Payer: BLUE CROSS/BLUE SHIELD | Admitting: Anesthesiology

## 2017-09-23 DIAGNOSIS — Z3A40 40 weeks gestation of pregnancy: Secondary | ICD-10-CM | POA: Diagnosis not present

## 2017-09-23 DIAGNOSIS — O9952 Diseases of the respiratory system complicating childbirth: Secondary | ICD-10-CM | POA: Diagnosis present

## 2017-09-23 DIAGNOSIS — Z3490 Encounter for supervision of normal pregnancy, unspecified, unspecified trimester: Secondary | ICD-10-CM | POA: Diagnosis present

## 2017-09-23 DIAGNOSIS — J45909 Unspecified asthma, uncomplicated: Secondary | ICD-10-CM | POA: Diagnosis not present

## 2017-09-23 DIAGNOSIS — O480 Post-term pregnancy: Secondary | ICD-10-CM | POA: Diagnosis not present

## 2017-09-23 DIAGNOSIS — Z23 Encounter for immunization: Secondary | ICD-10-CM | POA: Diagnosis not present

## 2017-09-23 LAB — ABO/RH: ABO/RH(D): B POS

## 2017-09-23 LAB — CBC
HCT: 38.8 % (ref 36.0–46.0)
Hemoglobin: 13 g/dL (ref 12.0–15.0)
MCH: 28 pg (ref 26.0–34.0)
MCHC: 33.5 g/dL (ref 30.0–36.0)
MCV: 83.4 fL (ref 78.0–100.0)
Platelets: 231 10*3/uL (ref 150–400)
RBC: 4.65 MIL/uL (ref 3.87–5.11)
RDW: 15.4 % (ref 11.5–15.5)
WBC: 10.6 10*3/uL — ABNORMAL HIGH (ref 4.0–10.5)

## 2017-09-23 LAB — TYPE AND SCREEN
ABO/RH(D): B POS
Antibody Screen: NEGATIVE

## 2017-09-23 LAB — RPR: RPR Ser Ql: NONREACTIVE

## 2017-09-23 MED ORDER — PHENYLEPHRINE 40 MCG/ML (10ML) SYRINGE FOR IV PUSH (FOR BLOOD PRESSURE SUPPORT)
80.0000 ug | PREFILLED_SYRINGE | INTRAVENOUS | Status: DC | PRN
  Filled 2017-09-23: qty 5

## 2017-09-23 MED ORDER — ACETAMINOPHEN 325 MG PO TABS: 650.0000 mg | ORAL_TABLET | ORAL | Status: DC | PRN

## 2017-09-23 MED ORDER — IBUPROFEN 600 MG PO TABS
600.0000 mg | ORAL_TABLET | Freq: Four times a day (QID) | ORAL | Status: DC
  Administered 2017-09-23 – 2017-09-24 (×4): 600 mg via ORAL
  Filled 2017-09-23 (×4): qty 1

## 2017-09-23 MED ORDER — FENTANYL 2.5 MCG/ML BUPIVACAINE 1/10 % EPIDURAL INFUSION (WH - ANES): 14.0000 mL/h | INTRAMUSCULAR | Status: DC | PRN

## 2017-09-23 MED ORDER — ZOLPIDEM TARTRATE 5 MG PO TABS: 5.0000 mg | ORAL_TABLET | Freq: Every evening | ORAL | Status: DC | PRN

## 2017-09-23 MED ORDER — PHENYLEPHRINE 40 MCG/ML (10ML) SYRINGE FOR IV PUSH (FOR BLOOD PRESSURE SUPPORT)
80.0000 ug | PREFILLED_SYRINGE | INTRAVENOUS | Status: DC | PRN
  Filled 2017-09-23: qty 10
  Filled 2017-09-23: qty 5

## 2017-09-23 MED ORDER — FENTANYL 2.5 MCG/ML BUPIVACAINE 1/10 % EPIDURAL INFUSION (WH - ANES)
14.0000 mL/h | INTRAMUSCULAR | Status: DC | PRN
  Administered 2017-09-23: 14 mL/h via EPIDURAL
  Filled 2017-09-23: qty 100

## 2017-09-23 MED ORDER — DIBUCAINE 1 % RE OINT: 1.0000 "application " | TOPICAL_OINTMENT | RECTAL | Status: DC | PRN

## 2017-09-23 MED ORDER — WITCH HAZEL-GLYCERIN EX PADS: 1.0000 "application " | MEDICATED_PAD | CUTANEOUS | Status: DC | PRN

## 2017-09-23 MED ORDER — SOD CITRATE-CITRIC ACID 500-334 MG/5ML PO SOLN: 30.0000 mL | ORAL | Status: DC | PRN

## 2017-09-23 MED ORDER — DIPHENHYDRAMINE HCL 25 MG PO CAPS: 25.0000 mg | ORAL_CAPSULE | Freq: Four times a day (QID) | ORAL | Status: DC | PRN

## 2017-09-23 MED ORDER — BENZOCAINE-MENTHOL 20-0.5 % EX AERO: 1.0000 "application " | INHALATION_SPRAY | CUTANEOUS | Status: DC | PRN

## 2017-09-23 MED ORDER — ONDANSETRON HCL 4 MG/2ML IJ SOLN: 4.0000 mg | INTRAMUSCULAR | Status: DC | PRN

## 2017-09-23 MED ORDER — OXYTOCIN 40 UNITS IN LACTATED RINGERS INFUSION - SIMPLE MED
1.0000 m[IU]/min | INTRAVENOUS | Status: DC
  Administered 2017-09-23: 2 m[IU]/min via INTRAVENOUS
  Administered 2017-09-23: 6 m[IU]/min via INTRAVENOUS
  Filled 2017-09-23: qty 1000

## 2017-09-23 MED ORDER — EPHEDRINE 5 MG/ML INJ
10.0000 mg | INTRAVENOUS | Status: DC | PRN
  Filled 2017-09-23: qty 2

## 2017-09-23 MED ORDER — MONTELUKAST SODIUM 10 MG PO TABS
10.0000 mg | ORAL_TABLET | Freq: Every day | ORAL | Status: DC
  Administered 2017-09-23: 10 mg via ORAL
  Filled 2017-09-23 (×2): qty 1

## 2017-09-23 MED ORDER — OXYCODONE-ACETAMINOPHEN 5-325 MG PO TABS: 1.0000 | ORAL_TABLET | ORAL | Status: DC | PRN

## 2017-09-23 MED ORDER — METHYLERGONOVINE MALEATE 0.2 MG/ML IJ SOLN: 0.2000 mg | INTRAMUSCULAR | Status: DC | PRN

## 2017-09-23 MED ORDER — PRENATAL MULTIVITAMIN CH
1.0000 | ORAL_TABLET | Freq: Every day | ORAL | Status: DC
  Administered 2017-09-24: 1 via ORAL
  Filled 2017-09-23: qty 1

## 2017-09-23 MED ORDER — TERBUTALINE SULFATE 1 MG/ML IJ SOLN
0.2500 mg | Freq: Once | INTRAMUSCULAR | Status: DC | PRN
  Filled 2017-09-23: qty 1

## 2017-09-23 MED ORDER — DIPHENHYDRAMINE HCL 50 MG/ML IJ SOLN: 12.5000 mg | INTRAMUSCULAR | Status: DC | PRN

## 2017-09-23 MED ORDER — METHYLERGONOVINE MALEATE 0.2 MG PO TABS: 0.2000 mg | ORAL_TABLET | ORAL | Status: DC | PRN

## 2017-09-23 MED ORDER — OXYCODONE-ACETAMINOPHEN 5-325 MG PO TABS: 2.0000 | ORAL_TABLET | ORAL | Status: DC | PRN

## 2017-09-23 MED ORDER — SENNOSIDES-DOCUSATE SODIUM 8.6-50 MG PO TABS
2.0000 | ORAL_TABLET | ORAL | Status: DC
  Administered 2017-09-23: 2 via ORAL
  Filled 2017-09-23: qty 2

## 2017-09-23 MED ORDER — TETANUS-DIPHTH-ACELL PERTUSSIS 5-2.5-18.5 LF-MCG/0.5 IM SUSP: 0.5000 mL | Freq: Once | INTRAMUSCULAR | Status: DC

## 2017-09-23 MED ORDER — COCONUT OIL OIL: 1.0000 "application " | TOPICAL_OIL | Status: DC | PRN

## 2017-09-23 MED ORDER — ONDANSETRON HCL 4 MG PO TABS: 4.0000 mg | ORAL_TABLET | ORAL | Status: DC | PRN

## 2017-09-23 MED ORDER — LIDOCAINE HCL (PF) 1 % IJ SOLN
INTRAMUSCULAR | Status: DC | PRN
  Administered 2017-09-23 (×2): 4 mL via EPIDURAL

## 2017-09-23 MED ORDER — SIMETHICONE 80 MG PO CHEW: 80.0000 mg | CHEWABLE_TABLET | ORAL | Status: DC | PRN

## 2017-09-23 MED ORDER — LACTATED RINGERS IV SOLN
500.0000 mL | Freq: Once | INTRAVENOUS | Status: AC
  Administered 2017-09-23: 1000 mL via INTRAVENOUS

## 2017-09-23 MED ORDER — ONDANSETRON HCL 4 MG/2ML IJ SOLN: 4.0000 mg | Freq: Four times a day (QID) | INTRAMUSCULAR | Status: DC | PRN

## 2017-09-23 NOTE — H&P (Signed)
Laura Bruce Bruce is a 37 y.o. female presenting for IOL for postdates. OB History    Gravida  3   Para  2   Term  2   Preterm      AB      Living  2     SAB      TAB      Ectopic      Multiple  0   Live Births  2          Past Medical History:  Diagnosis Date  . Asthma   . Postpartum care following vaginal delivery (8/18) 11/20/2012  . Vaginal Pap smear, abnormal    Past Surgical History:  Procedure Laterality Date  . CHOLECYSTECTOMY N/A 03/16/2013   Procedure: LAPAROSCOPIC CHOLECYSTECTOMY WITH INTRAOPERATIVE CHOLANGIOGRAM;  Surgeon: Velora Hecklerodd M Gerkin, MD;  [facility];  Service: General;  Laterality: N/A;   Family History: family history includes Cancer in her maternal grandmother; Heart attack in her paternal grandfather. Social History:  reports that she has never smoked. She has never used smokeless tobacco. She reports that she does not drink alcohol or use drugs.     Maternal Diabetes: No Genetic Screening: Normal Maternal Ultrasounds/Referrals: Normal Fetal Ultrasounds or other Referrals:  None Maternal Substance Abuse:  No Significant Maternal Medications:  None Significant Maternal Lab Results:  None Other Comments:  None  Review of Systems  Constitutional: Negative.   All other systems reviewed and are negative.  Maternal Medical History:  Reason for admission: Contractions.   Contractions: Onset was less than 1 hour ago.   Frequency: irregular.   Perceived severity is mild.    Fetal activity: Perceived fetal activity is normal.   Last perceived fetal movement was within the past hour.    Prenatal complications: no prenatal complications Prenatal Complications - Diabetes: none.    Dilation: 10 Effacement (%): 100 Station: Plus 1 Exam by:: lee Blood pressure 121/71, pulse 86, temperature 97.7 F (36.5 C), temperature source Oral, resp. rate 16, height 5\' 3"  (1.6 m), weight 86.6 kg (191 lb), SpO2 97 %, unknown if currently  breastfeeding. Maternal Exam:  Uterine Assessment: Contraction strength is mild.  Contraction frequency is irregular.   Abdomen: Patient reports no abdominal tenderness. Fetal presentation: vertex  Introitus: Normal vulva. Normal vagina.  Ferning test: negative.  Nitrazine test: negative. Amniotic fluid character: not assessed.  Pelvis: adequate for delivery.   Cervix: Cervix evaluated by digital exam.     Physical Exam  Nursing note and vitals reviewed. Constitutional: She is oriented to person, place, and time. She appears well-developed and well-nourished.  HENT:  Head: Normocephalic and atraumatic.  Neck: Normal range of motion. Neck supple.  Cardiovascular: Normal rate and regular rhythm.  Respiratory: Effort normal and breath sounds normal.  GI: Soft. Bowel sounds are normal.  Genitourinary: Vagina normal and uterus normal.  Musculoskeletal: Normal range of motion.  Neurological: She is alert and oriented to person, place, and time. She has normal reflexes.  Skin: Skin is warm and dry.  Psychiatric: She has a normal mood and affect.    Prenatal labs: ABO, Rh: --/--/B POS, B POS Performed at [facility], [address] [place], [place] [address]  [phone](06/21 0730) Antibody: NEG (06/21 0730) Rubella: Immune (11/15 0000) RPR: Nonreactive (11/15 0000)  HBsAg: Negative (11/15 0000)  HIV: Non-reactive (11/15 0000)  GBS: Negative (05/29 0000)   Assessment/Plan: 40+ weeks IOL   Darreon Lutes J 09/23/2017, 12:25 PM

## 2017-09-23 NOTE — Anesthesia Preprocedure Evaluation (Signed)
Anesthesia Evaluation  Patient identified by MRN, date of birth, ID band Patient awake    Reviewed: Allergy & Precautions, H&P , NPO status , Patient's Chart, lab work & pertinent test results  History of Anesthesia Complications Negative for: history of anesthetic complications  Airway Mallampati: II  TM Distance: >3 FB Neck ROM: full    Dental no notable dental hx. (+) Teeth Intact   Pulmonary asthma ,    Pulmonary exam normal breath sounds clear to auscultation       Cardiovascular negative cardio ROS   Rhythm:regular Rate:Normal     Neuro/Psych negative neurological ROS  negative psych ROS   GI/Hepatic negative GI ROS, Neg liver ROS,   Endo/Other  negative endocrine ROS  Renal/GU negative Renal ROS  negative genitourinary   Musculoskeletal   Abdominal Normal abdominal exam  (+)   Peds  Hematology   Anesthesia Other Findings   Reproductive/Obstetrics (+) Pregnancy                             Anesthesia Physical  Anesthesia Plan  ASA: II  Anesthesia Plan: Epidural   Post-op Pain Management:    Induction:   PONV Risk Score and Plan:   Airway Management Planned:   Additional Equipment:   Intra-op Plan:   Post-operative Plan:   Informed Consent: I have reviewed the patients History and Physical, chart, labs and discussed the procedure including the risks, benefits and alternatives for the proposed anesthesia with the patient or authorized representative who has indicated his/her understanding and acceptance.     Plan Discussed with:   Anesthesia Plan Comments:         Anesthesia Quick Evaluation

## 2017-09-23 NOTE — Anesthesia Procedure Notes (Signed)
Epidural Patient location during procedure: OB  Staffing Anesthesiologist: Korbyn Chopin, MD Performed: anesthesiologist   Preanesthetic Checklist Completed: patient identified, pre-op evaluation, timeout performed, IV checked, risks and benefits discussed and monitors and equipment checked  Epidural Patient position: sitting Prep: site prepped and draped and DuraPrep Patient monitoring: heart rate, continuous pulse ox and blood pressure Approach: midline Location: L3-L4 Injection technique: LOR air and LOR saline  Needle:  Needle type: Tuohy  Needle gauge: 17 G Needle length: 9 cm Needle insertion depth: 6 cm Catheter type: closed end flexible Catheter size: 19 Gauge Catheter at skin depth: 11 cm Test dose: negative  Assessment Sensory level: T8 Events: blood not aspirated, injection not painful, no injection resistance, negative IV test and no paresthesia  Additional Notes Reason for block:procedure for pain     

## 2017-09-23 NOTE — Anesthesia Postprocedure Evaluation (Signed)
Anesthesia Post Note  Patient: Griffith CitronRachel N Powless  Procedure(s) Performed: AN AD HOC LABOR EPIDURAL     Patient location during evaluation: Mother Baby Anesthesia Type: Epidural Level of consciousness: awake and alert Pain management: pain level controlled Vital Signs Assessment: post-procedure vital signs reviewed and stable Respiratory status: spontaneous breathing, nonlabored ventilation and respiratory function stable Cardiovascular status: stable Postop Assessment: no headache, no backache, epidural receding, no apparent nausea or vomiting, patient able to bend at knees, able to ambulate and adequate PO intake Anesthetic complications: no    Last Vitals:  Vitals:   09/23/17 1345 09/23/17 1453  BP: 118/77 109/82  Pulse: 76 78  Resp: 20 20  Temp: 36.7 C 36.7 C  SpO2:      Last Pain:  Vitals:   09/23/17 1518  TempSrc:   PainSc: 1    Pain Goal:                 Laban EmperorMalinova,Fifi Schindler Hristova

## 2017-09-24 LAB — CBC
HCT: 33.2 % — ABNORMAL LOW (ref 36.0–46.0)
Hemoglobin: 11 g/dL — ABNORMAL LOW (ref 12.0–15.0)
MCH: 27.6 pg (ref 26.0–34.0)
MCHC: 33.1 g/dL (ref 30.0–36.0)
MCV: 83.4 fL (ref 78.0–100.0)
Platelets: 180 10*3/uL (ref 150–400)
RBC: 3.98 MIL/uL (ref 3.87–5.11)
RDW: 15.7 % — ABNORMAL HIGH (ref 11.5–15.5)
WBC: 10.9 10*3/uL — ABNORMAL HIGH (ref 4.0–10.5)

## 2017-09-24 MED ORDER — ACETAMINOPHEN 325 MG PO TABS: 650.0000 mg | ORAL_TABLET | ORAL | Status: AC | PRN

## 2017-09-24 MED ORDER — COCONUT OIL OIL: 1.0000 "application " | TOPICAL_OIL | 0 refills | Status: AC | PRN

## 2017-09-24 MED ORDER — BENZOCAINE-MENTHOL 20-0.5 % EX AERO: 1.0000 "application " | INHALATION_SPRAY | CUTANEOUS | Status: AC | PRN

## 2017-09-24 MED ORDER — IBUPROFEN 600 MG PO TABS: 600.0000 mg | ORAL_TABLET | Freq: Four times a day (QID) | ORAL | 0 refills | Status: AC

## 2017-09-24 NOTE — Discharge Instructions (Signed)
[facility], early years[facility]

## 2017-09-24 NOTE — Progress Notes (Signed)
PPD 1 SVD w/ intact perineum Girl "Lanora ManisElizabeth" S:  Reports feeling good             Tolerating po/ No nausea or vomiting             Bleeding is moderate, no clots             Pain controlled with PO meds             Up ad lib / ambulatory / voiding w/o difficulty  Newborn Breast   O:               VS: BP 90/60 (BP Location: Left Arm)   Pulse 67   Temp 97.7 F (36.5 C)   Resp 18   Ht 5\' 3"  (1.6 m)   Wt 86.6 kg (191 lb)   SpO2 97%     LABS:              Recent Labs    09/23/17 0730 09/24/17 0513  WBC 10.6* 10.9*  HGB 13.0 11.0*  PLT 231 180   Blood type: --/--/B POS, B POS (06/21 0730) Rubella: Immune (11/15 0000)                    I&O: Intake/Output      06/21 0701 - 06/22 0700 06/22 0701 - 06/23 0700   Blood 100    Total Output 100    Net -100          Physical Exam:             Alert and oriented X3  Lungs: Clear and unlabored  Heart: regular rate and rhythm / no mumurs  Abdomen: soft, non-tender, non-distended              Fundus: firm, non-tender, U-1  Perineum: Intact, non-edematous  Lochia: appropriate, no clots  Extremities: no edema, no calf pain, tenderness, or cords    A:  36 y.o G3P3003 PPD #1 SVD w/ intact perineum Stable Breastfeeding  P:  Routine PP care Discharge home Follow-up in 6 wks w/ Dr. Billy Coastaavon

## 2017-09-24 NOTE — Lactation Note (Signed)
This note was copied from a baby's chart. Lactation Consultation Note  Patient Name: Laura Bruce XBMWU'XToday's Date: 09/24/2017 Reason for consult: Initial assessment;Other (Comment);Term(expericenced breast feeding mother or 2 others , now 353 and 39 years old / early D/C at 1224 hours )  Baby is 9123 hours old and has LC entered the room, mom holding baby and she was calm and alert.  Per mom breast feeding is going well without soreness, and baby last fed at 1025 for 40 mins with swallows.  LC reviewed and updated the doc flow sheets per mom.  Sore nipple and engorgement prevention and tx reviewed. - see doc flow sheets for details.  LC offered mom a hand pump/ mom receptive/ and LC instructed mom on the use of the hand pump.  Per mom in the past have had to increase flange to #27 and already has it at home.  Also has a DEBP Medela at  Home.  Mother informed of post-discharge support and given phone number to the lactation department, including services for phone call assistance; out-patient appointments; and breastfeeding support group. List of other breastfeeding resources in the community given in the handout. Encouraged mother to call for problems or concerns related to breastfeeding.   Maternal Data Has patient been taught Hand Expression?: (mom experienced / extended family walked in at consult ) Does the patient have breastfeeding experience prior to this delivery?: Yes  Feeding Feeding Type: (last fed at 1025am for 40 mins ) Length of feed: 40 min(per mom )  LATCH Score                   Interventions Interventions: Breast feeding basics reviewed;Hand pump  Lactation Tools Discussed/Used Tools: Pump;Flanges Flange Size: 24;27;Other (comment)(per mom has #27 F at home and thats what she uses ) Breast pump type: Manual WIC Program: No Pump Review: Setup, frequency, and cleaning;Milk Storage Initiated by:: MAI  Date initiated:: 09/24/17   Consult Status Consult  Status: Complete    Laura Bruce 09/24/2017, 11:45 AM

## 2017-09-24 NOTE — Discharge Summary (Addendum)
OB Discharge Summary     Patient Name: Laura Bruce DOB: Oct 21, 1980 MRN: [id number]  Date of admission: 09/23/2017 Delivering MD: Olivia MackieAAVON, RICHARD   Date of discharge: 09/24/2017  Admitting diagnosis: INDUCTION Intrauterine pregnancy: 5759w3d     Secondary diagnosis:  Principal Problem:   SVD 09/23/2017 Active Problems:   Postpartum care following vaginal delivery 09/23/2017  Additional problems: History of abnormal Pap     Discharge diagnosis: Term Pregnancy Delivered                                                                                                Post partum procedures:None  Augmentation: AROM and Pitocin  Complications: None  Hospital course:  Induction of Labor With Vaginal Delivery   37 y.o. yo G3P3003 at 8959w3d was admitted to the hospital 09/23/2017 for induction of labor.  Indication for induction: Postdates.  Patient had an uncomplicated labor course as follows: Membrane Rupture Time/Date: 7:50 AM ,09/23/2017   Intrapartum Procedures: Episiotomy: None [1]                                         Lacerations:  None [1]  Patient had delivery of a Viable infant.  Information for the patient's newborn:  Laura Bruce, Girl Laura Bruce [[id number]][[id number]]  Delivery Method: Vaginal, Spontaneous(Filed from Delivery Summary)   09/23/2017  Details of delivery can be found in separate delivery note.  Patient had a routine postpartum course. Patient is discharged home 09/24/17.  Physical exam  Vitals:   09/23/17 1453 09/23/17 1850 09/23/17 2115 09/24/17 0500  BP: 109/82 108/77 100/60 90/60  Pulse: 78 90 72 67  Resp: 20 18 18 18   Temp: 98.1 F (36.7 C) 98.1 F (36.7 C)  97.7 F (36.5 C)  TempSrc: Oral     SpO2:   97%   Weight:      Height:       General: alert, cooperative and no distress Lochia: appropriate Uterine Fundus: firm Incision: N/A DVT Evaluation: No evidence of DVT seen on physical exam. No cords, calf tenderness, and no significant calf/ankle  edema Labs: Lab Results  Component Value Date   WBC 10.9 (H) 09/24/2017   HGB 11.0 (L) 09/24/2017   HCT 33.2 (L) 09/24/2017   MCV 83.4 09/24/2017   PLT 180 09/24/2017    Discharge instruction: per After Visit Summary and "Baby and Me Booklet".  Allergies as of 09/24/2017   No Known Allergies     Medication List    TAKE these medications   acetaminophen 325 MG tablet Commonly known as:  TYLENOL Take 2 tablets (650 mg total) by mouth every 4 (four) hours as needed (for pain scale < 4).   albuterol 108 (90 Base) MCG/ACT inhaler Commonly known as:  PROVENTIL HFA;VENTOLIN HFA Inhale 1-2 puffs into the lungs every 6 (six) hours as needed for wheezing or shortness of breath.   benzocaine-Menthol 20-0.5 % Aero Commonly known as:  DERMOPLAST Apply 1 application topically as needed for irritation (perineal discomfort).  calcium carbonate 1250 (500 Ca) MG chewable tablet Commonly known as:  OS-CAL Chew 1 tablet by mouth daily.   calcium carbonate 500 MG chewable tablet Commonly known as:  TUMS - dosed in mg elemental calcium Chew 1 tablet by mouth 2 (two) times daily as needed for indigestion or heartburn.   cetirizine 10 MG tablet Commonly known as:  ZYRTEC Take 10 mg by mouth daily.   coconut oil Oil Apply 1 application topically as needed.   fluticasone 50 MCG/ACT nasal spray Commonly known as:  FLONASE Place 2 sprays into both nostrils daily as needed for allergies or rhinitis.   ibuprofen 600 MG tablet Commonly known as:  ADVIL,MOTRIN Take 1 tablet (600 mg total) by mouth every 6 (six) hours.   montelukast 10 MG tablet Commonly known as:  SINGULAIR Take 10 mg by mouth at bedtime.   multivitamin with minerals Tabs tablet Take 1 tablet by mouth daily.   multivitamin-prenatal 27-0.8 MG Tabs tablet Take 1 tablet by mouth daily at 12 noon.            Discharge Care Instructions  (From admission, onward)        Start     Ordered   09/24/17 0000   Discharge wound care:    Comments:  Sitz baths 2 times /day with warm water x 1 week   09/24/17 0956      .     Diet: routine diet  Activity: Advance as tolerated. Pelvic rest for 6 weeks.   Outpatient follow up:6 weeks Follow up Appt: with Dr. Billy Coast Follow up Visit: Call [facility] for postpartum appointment  Postpartum contraception: Will discuss at 6 week postpartum visit  Newborn Data: Live born female Laura Bruce Birth Weight: 7 lb 3 oz (3260 g) APGAR: 8, 10  Newborn Delivery   Birth date/time:  09/23/2017 12:02:00 Delivery type:  Vaginal, Spontaneous     Baby Feeding: Breast Disposition:home with mother   Laura Bruce, [name] 09/24/2017 9:54 AM   Medical screening examination/treatment/procedure(s) were conducted as a shared visit with non-physician practitioner(s) and myself.  I personally evaluated the patient during the encounter.   Neta Mends, CNM, MSN 09/24/2017, 9:58 AM

## 2017-11-14 DIAGNOSIS — Z124 Encounter for screening for malignant neoplasm of cervix: Secondary | ICD-10-CM | POA: Diagnosis not present

## 2018-03-16 DIAGNOSIS — L814 Other melanin hyperpigmentation: Secondary | ICD-10-CM | POA: Diagnosis not present

## 2018-03-16 DIAGNOSIS — L821 Other seborrheic keratosis: Secondary | ICD-10-CM | POA: Diagnosis not present

## 2018-03-16 DIAGNOSIS — D2372 Other benign neoplasm of skin of left lower limb, including hip: Secondary | ICD-10-CM | POA: Diagnosis not present

## 2018-03-16 DIAGNOSIS — D2272 Melanocytic nevi of left lower limb, including hip: Secondary | ICD-10-CM | POA: Diagnosis not present

## 2018-03-16 DIAGNOSIS — D225 Melanocytic nevi of trunk: Secondary | ICD-10-CM | POA: Diagnosis not present

## 2018-03-16 DIAGNOSIS — L218 Other seborrheic dermatitis: Secondary | ICD-10-CM | POA: Diagnosis not present

## 2018-06-14 DIAGNOSIS — Z0000 Encounter for general adult medical examination without abnormal findings: Secondary | ICD-10-CM | POA: Diagnosis not present

## 2018-06-14 DIAGNOSIS — Z1329 Encounter for screening for other suspected endocrine disorder: Secondary | ICD-10-CM | POA: Diagnosis not present

## 2018-06-14 DIAGNOSIS — Z13220 Encounter for screening for lipoid disorders: Secondary | ICD-10-CM | POA: Diagnosis not present

## 2018-06-14 DIAGNOSIS — Z131 Encounter for screening for diabetes mellitus: Secondary | ICD-10-CM | POA: Diagnosis not present

## 2018-08-24 DIAGNOSIS — E039 Hypothyroidism, unspecified: Secondary | ICD-10-CM | POA: Diagnosis not present

## 2018-11-17 DIAGNOSIS — Z01419 Encounter for gynecological examination (general) (routine) without abnormal findings: Secondary | ICD-10-CM | POA: Diagnosis not present

## 2018-11-17 DIAGNOSIS — Z1151 Encounter for screening for human papillomavirus (HPV): Secondary | ICD-10-CM | POA: Diagnosis not present

## 2018-11-17 DIAGNOSIS — Z6831 Body mass index (BMI) 31.0-31.9, adult: Secondary | ICD-10-CM | POA: Diagnosis not present

## 2019-03-28 DIAGNOSIS — E039 Hypothyroidism, unspecified: Secondary | ICD-10-CM | POA: Diagnosis not present

## 2019-04-04 DIAGNOSIS — Z20828 Contact with and (suspected) exposure to other viral communicable diseases: Secondary | ICD-10-CM | POA: Diagnosis not present

## 2019-04-16 ENCOUNTER — Ambulatory Visit: Payer: Self-pay | Attending: Internal Medicine

## 2019-04-16 DIAGNOSIS — Z6831 Body mass index (BMI) 31.0-31.9, adult: Secondary | ICD-10-CM | POA: Diagnosis not present

## 2019-04-16 DIAGNOSIS — Z20822 Contact with and (suspected) exposure to covid-19: Secondary | ICD-10-CM | POA: Diagnosis not present

## 2019-04-17 DIAGNOSIS — Z1231 Encounter for screening mammogram for malignant neoplasm of breast: Secondary | ICD-10-CM | POA: Diagnosis not present

## 2019-06-15 DIAGNOSIS — J4520 Mild intermittent asthma, uncomplicated: Secondary | ICD-10-CM | POA: Diagnosis not present

## 2019-06-15 DIAGNOSIS — E039 Hypothyroidism, unspecified: Secondary | ICD-10-CM | POA: Diagnosis not present

## 2019-06-15 DIAGNOSIS — Z13220 Encounter for screening for lipoid disorders: Secondary | ICD-10-CM | POA: Diagnosis not present

## 2019-06-15 DIAGNOSIS — R1310 Dysphagia, unspecified: Secondary | ICD-10-CM | POA: Diagnosis not present

## 2019-06-15 DIAGNOSIS — Z0000 Encounter for general adult medical examination without abnormal findings: Secondary | ICD-10-CM | POA: Diagnosis not present

## 2019-06-15 DIAGNOSIS — J309 Allergic rhinitis, unspecified: Secondary | ICD-10-CM | POA: Diagnosis not present

## 2019-06-18 ENCOUNTER — Other Ambulatory Visit: Payer: Self-pay | Admitting: Family Medicine

## 2019-06-18 DIAGNOSIS — R1310 Dysphagia, unspecified: Secondary | ICD-10-CM

## 2019-06-28 ENCOUNTER — Ambulatory Visit
Admission: RE | Admit: 2019-06-28 | Discharge: 2019-06-28 | Disposition: A | Discharge status: Home / Self Care | Payer: BC Managed Care – PPO | Source: Ambulatory Visit | Attending: Family Medicine | Admitting: Family Medicine

## 2019-06-28 DIAGNOSIS — R1310 Dysphagia, unspecified: Secondary | ICD-10-CM

## 2019-07-25 DIAGNOSIS — R1310 Dysphagia, unspecified: Secondary | ICD-10-CM | POA: Diagnosis not present

## 2019-08-14 DIAGNOSIS — Z1159 Encounter for screening for other viral diseases: Secondary | ICD-10-CM | POA: Diagnosis not present

## 2019-08-17 DIAGNOSIS — K449 Diaphragmatic hernia without obstruction or gangrene: Secondary | ICD-10-CM | POA: Diagnosis not present

## 2019-08-17 DIAGNOSIS — K209 Esophagitis, unspecified without bleeding: Secondary | ICD-10-CM | POA: Diagnosis not present

## 2019-08-17 DIAGNOSIS — K3189 Other diseases of stomach and duodenum: Secondary | ICD-10-CM | POA: Diagnosis not present

## 2019-08-17 DIAGNOSIS — K222 Esophageal obstruction: Secondary | ICD-10-CM | POA: Diagnosis not present

## 2019-08-17 DIAGNOSIS — K2930 Chronic superficial gastritis without bleeding: Secondary | ICD-10-CM | POA: Diagnosis not present

## 2019-08-17 DIAGNOSIS — R1310 Dysphagia, unspecified: Secondary | ICD-10-CM | POA: Diagnosis not present

## 2019-08-31 DIAGNOSIS — B349 Viral infection, unspecified: Secondary | ICD-10-CM | POA: Diagnosis not present

## 2019-10-16 DIAGNOSIS — K200 Eosinophilic esophagitis: Secondary | ICD-10-CM | POA: Diagnosis not present

## 2020-01-03 DIAGNOSIS — Z01419 Encounter for gynecological examination (general) (routine) without abnormal findings: Secondary | ICD-10-CM | POA: Diagnosis not present

## 2020-01-03 DIAGNOSIS — Z6831 Body mass index (BMI) 31.0-31.9, adult: Secondary | ICD-10-CM | POA: Diagnosis not present

## 2020-02-13 DIAGNOSIS — R87610 Atypical squamous cells of undetermined significance on cytologic smear of cervix (ASC-US): Secondary | ICD-10-CM | POA: Diagnosis not present

## 2020-02-13 DIAGNOSIS — R87619 Unspecified abnormal cytological findings in specimens from cervix uteri: Secondary | ICD-10-CM | POA: Diagnosis not present

## 2020-05-21 DIAGNOSIS — Z1231 Encounter for screening mammogram for malignant neoplasm of breast: Secondary | ICD-10-CM | POA: Diagnosis not present

## 2020-06-16 DIAGNOSIS — E039 Hypothyroidism, unspecified: Secondary | ICD-10-CM | POA: Diagnosis not present

## 2020-06-16 DIAGNOSIS — Z0000 Encounter for general adult medical examination without abnormal findings: Secondary | ICD-10-CM | POA: Diagnosis not present

## 2020-06-16 DIAGNOSIS — Z13220 Encounter for screening for lipoid disorders: Secondary | ICD-10-CM | POA: Diagnosis not present

## 2020-06-16 DIAGNOSIS — Z131 Encounter for screening for diabetes mellitus: Secondary | ICD-10-CM | POA: Diagnosis not present

## 2021-01-13 DIAGNOSIS — M9904 Segmental and somatic dysfunction of sacral region: Secondary | ICD-10-CM | POA: Diagnosis not present

## 2021-01-13 DIAGNOSIS — M9903 Segmental and somatic dysfunction of lumbar region: Secondary | ICD-10-CM | POA: Diagnosis not present

## 2021-01-13 DIAGNOSIS — M9905 Segmental and somatic dysfunction of pelvic region: Secondary | ICD-10-CM | POA: Diagnosis not present

## 2021-01-13 DIAGNOSIS — M62830 Muscle spasm of back: Secondary | ICD-10-CM | POA: Diagnosis not present

## 2021-01-20 DIAGNOSIS — M9904 Segmental and somatic dysfunction of sacral region: Secondary | ICD-10-CM | POA: Diagnosis not present

## 2021-01-20 DIAGNOSIS — M9903 Segmental and somatic dysfunction of lumbar region: Secondary | ICD-10-CM | POA: Diagnosis not present

## 2021-01-20 DIAGNOSIS — M62830 Muscle spasm of back: Secondary | ICD-10-CM | POA: Diagnosis not present

## 2021-01-20 DIAGNOSIS — M9905 Segmental and somatic dysfunction of pelvic region: Secondary | ICD-10-CM | POA: Diagnosis not present

## 2021-01-26 DIAGNOSIS — H10023 Other mucopurulent conjunctivitis, bilateral: Secondary | ICD-10-CM | POA: Diagnosis not present

## 2021-02-15 IMAGING — RF DG ESOPHAGUS
5 series · 14 of 16 positions shown · non-contrast
Comparison: None.

CLINICAL DATA: Dysphasia.

EXAM:
ESOPHOGRAM / BARIUM SWALLOW / BARIUM TABLET STUDY
TECHNIQUE: Combined double contrast and single contrast examination performed
using effervescent crystals, thick barium liquid, and thin barium
liquid. The patient was observed with fluoroscopy swallowing a 13 mm
barium sulphate tablet.
FLUOROSCOPY TIME:  Fluoroscopy Time:  48 seconds
Radiation Exposure Index (if provided by the fluoroscopic device):
35 mGy
Number of Acquired Spot Images: 0

[Series 1: one shot · 3 of 4 slices shown (1 of 3)]
[im 1/4]
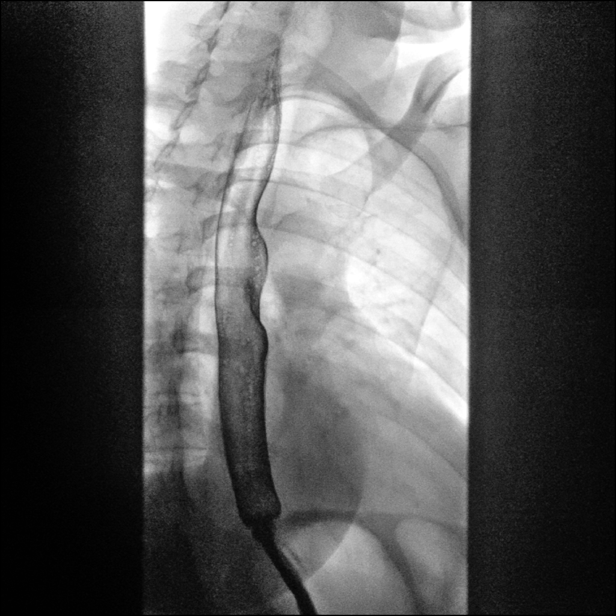
[im 2/4]
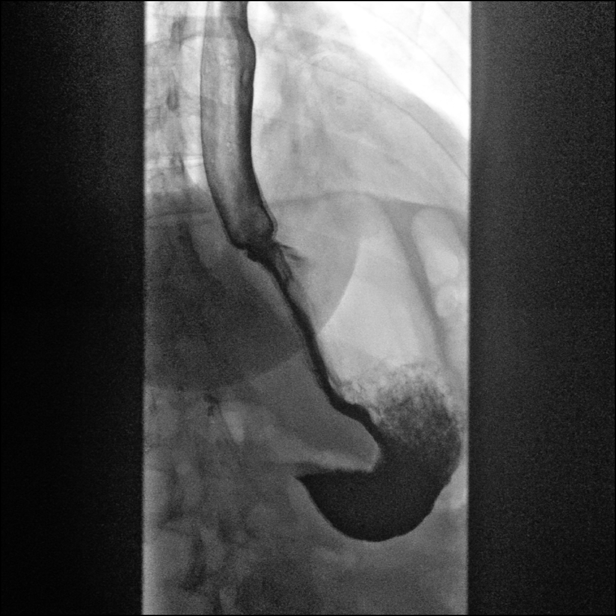
[im 3/4]
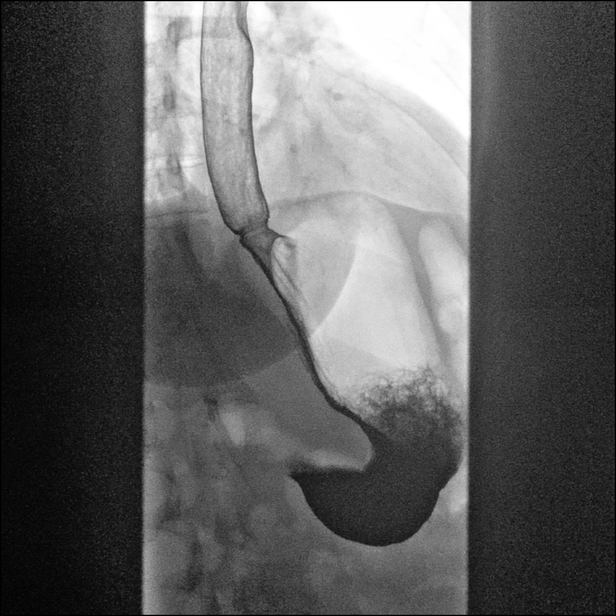

[Series 2: sequence · 4 of 71 frames shown (1 of 2)]
[frame 11/71]
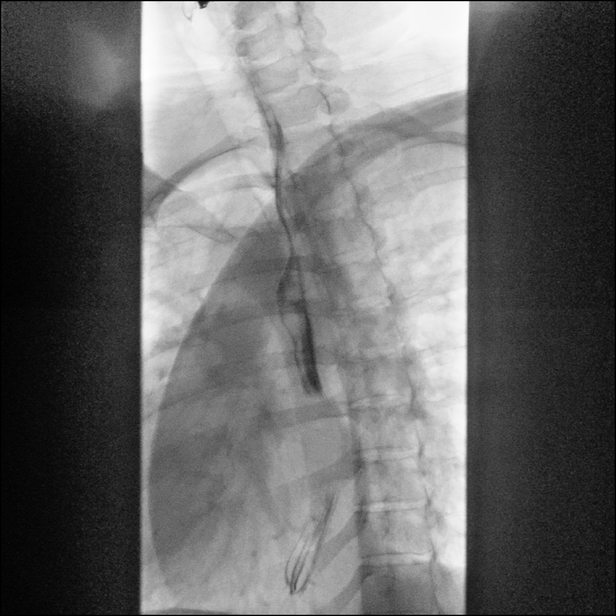
[frame 24/71]
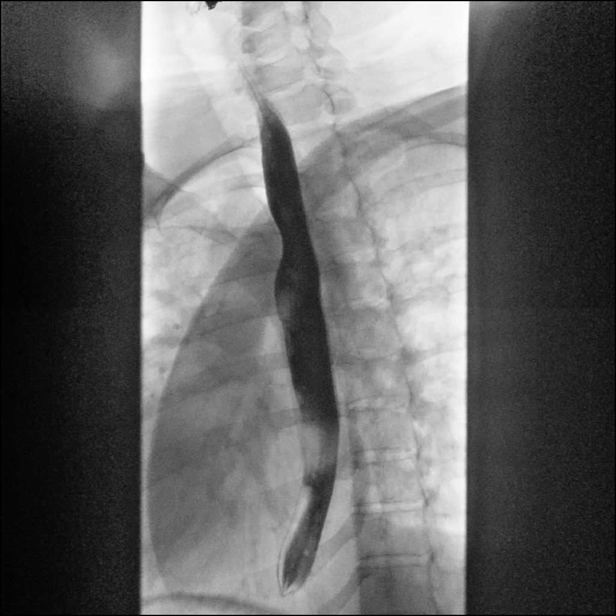
[frame 36/71]
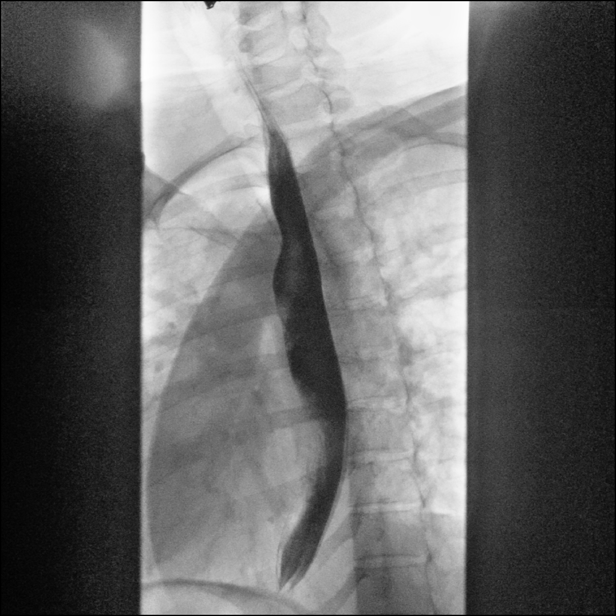
[frame 61/71]
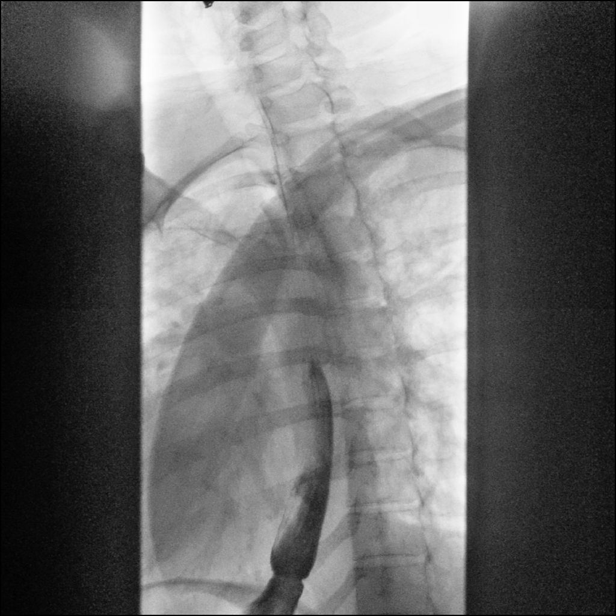

[Series 3: one shot · 2 of 2 slices shown (2 of 3)]
[im 1/2]
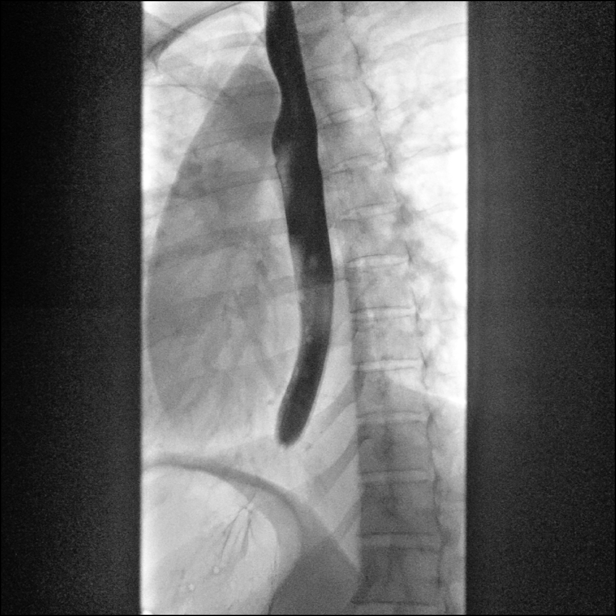
[im 2/2]
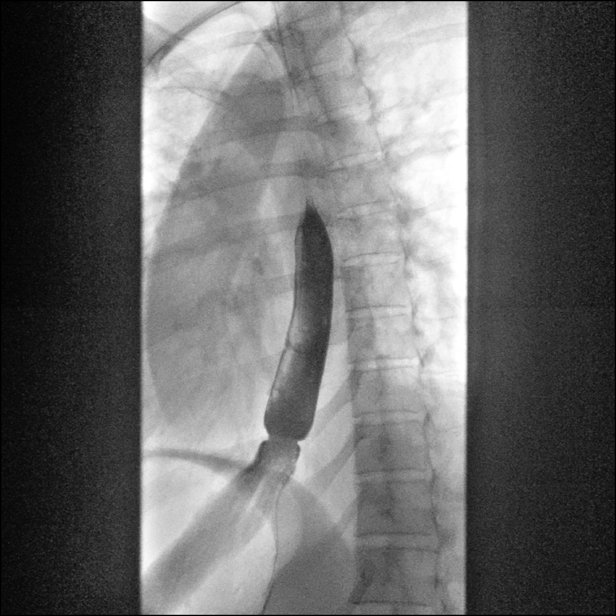

[Series 4: sequence · 3 of 32 frames shown (2 of 2)]
[frame 4/32]
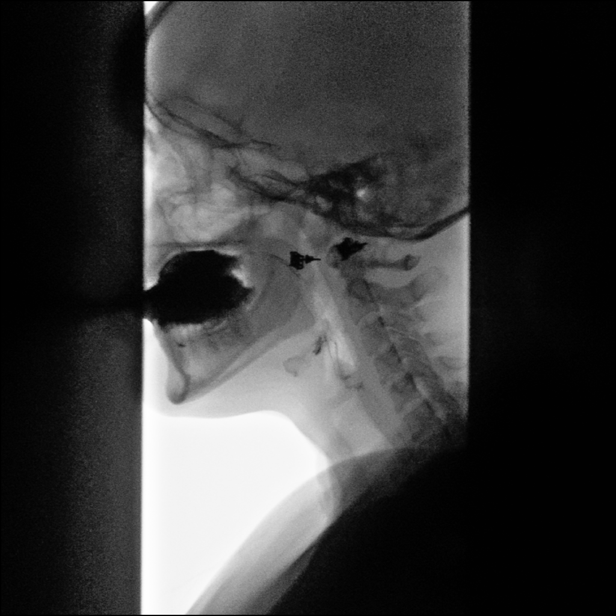
[frame 17/32]
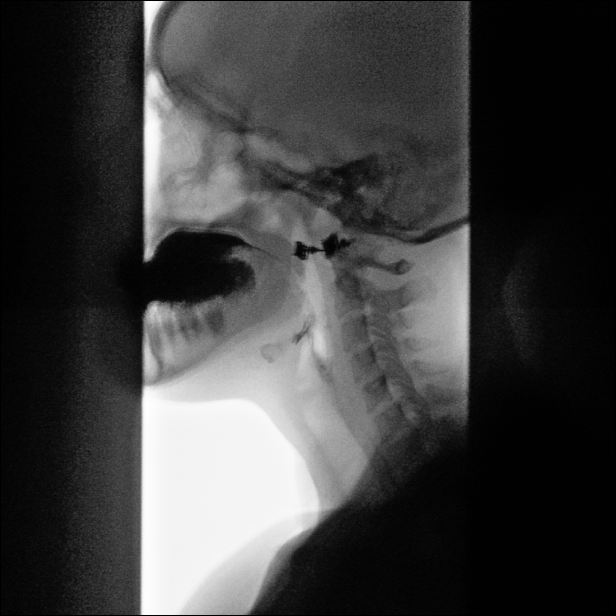
[frame 28/32]
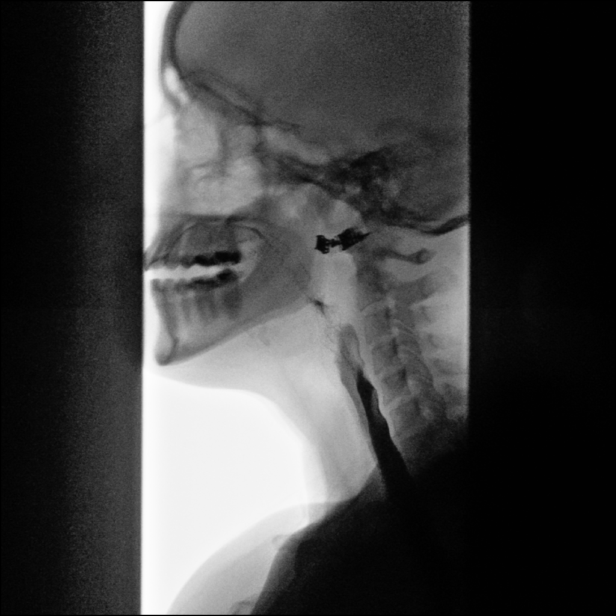

[Series 5: one shot · 2 of 2 slices shown (3 of 3)]
[im 1/2]
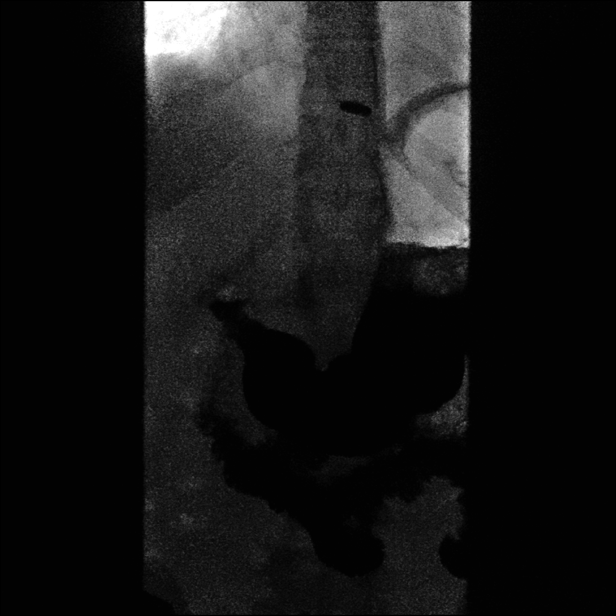
[im 2/2]
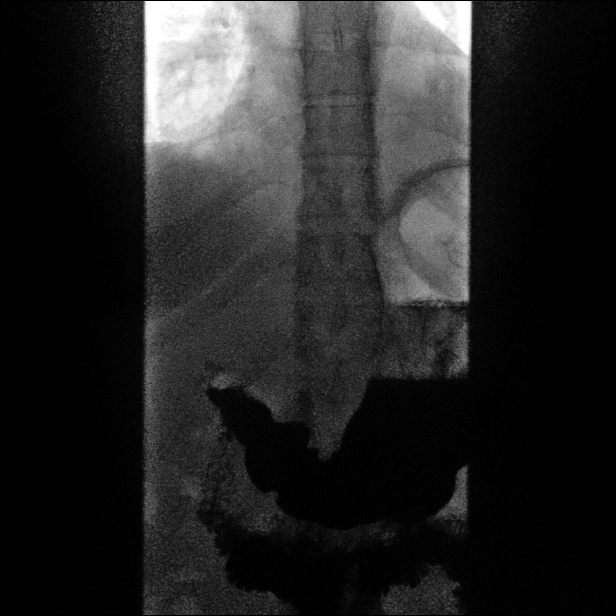

[14 of 16 positions shown; findings below may reference images not displayed]

FINDINGS: The esophagus is patent. There is no stricture or mass identified.
13 mm barium tablet was ingested which easily passed through the GE
junction into the stomach. The motility of the esophagus appears
normal. The swallowing mechanism is normal. No aspiration. No hiatal
hernia or reflux identified.
IMPRESSION: Normal exam.

## 2021-02-19 DIAGNOSIS — Z124 Encounter for screening for malignant neoplasm of cervix: Secondary | ICD-10-CM | POA: Diagnosis not present

## 2021-02-19 DIAGNOSIS — Z3041 Encounter for surveillance of contraceptive pills: Secondary | ICD-10-CM | POA: Diagnosis not present

## 2021-02-19 DIAGNOSIS — Z6833 Body mass index (BMI) 33.0-33.9, adult: Secondary | ICD-10-CM | POA: Diagnosis not present

## 2021-02-19 DIAGNOSIS — Z01411 Encounter for gynecological examination (general) (routine) with abnormal findings: Secondary | ICD-10-CM | POA: Diagnosis not present

## 2021-02-19 DIAGNOSIS — Z01419 Encounter for gynecological examination (general) (routine) without abnormal findings: Secondary | ICD-10-CM | POA: Diagnosis not present

## 2021-03-11 DIAGNOSIS — M7581 Other shoulder lesions, right shoulder: Secondary | ICD-10-CM | POA: Diagnosis not present

## 2021-03-11 DIAGNOSIS — M542 Cervicalgia: Secondary | ICD-10-CM | POA: Diagnosis not present

## 2021-05-19 DIAGNOSIS — M9906 Segmental and somatic dysfunction of lower extremity: Secondary | ICD-10-CM | POA: Diagnosis not present

## 2021-05-19 DIAGNOSIS — M9905 Segmental and somatic dysfunction of pelvic region: Secondary | ICD-10-CM | POA: Diagnosis not present

## 2021-05-19 DIAGNOSIS — M9903 Segmental and somatic dysfunction of lumbar region: Secondary | ICD-10-CM | POA: Diagnosis not present

## 2021-05-19 DIAGNOSIS — M9902 Segmental and somatic dysfunction of thoracic region: Secondary | ICD-10-CM | POA: Diagnosis not present

## 2021-05-21 DIAGNOSIS — M9903 Segmental and somatic dysfunction of lumbar region: Secondary | ICD-10-CM | POA: Diagnosis not present

## 2021-05-21 DIAGNOSIS — M9902 Segmental and somatic dysfunction of thoracic region: Secondary | ICD-10-CM | POA: Diagnosis not present

## 2021-05-21 DIAGNOSIS — M9906 Segmental and somatic dysfunction of lower extremity: Secondary | ICD-10-CM | POA: Diagnosis not present

## 2021-05-21 DIAGNOSIS — M9905 Segmental and somatic dysfunction of pelvic region: Secondary | ICD-10-CM | POA: Diagnosis not present

## 2021-05-21 DIAGNOSIS — S76112A Strain of left quadriceps muscle, fascia and tendon, initial encounter: Secondary | ICD-10-CM | POA: Diagnosis not present

## 2021-05-25 DIAGNOSIS — Z1231 Encounter for screening mammogram for malignant neoplasm of breast: Secondary | ICD-10-CM | POA: Diagnosis not present

## 2021-05-26 DIAGNOSIS — M9903 Segmental and somatic dysfunction of lumbar region: Secondary | ICD-10-CM | POA: Diagnosis not present

## 2021-05-26 DIAGNOSIS — M9905 Segmental and somatic dysfunction of pelvic region: Secondary | ICD-10-CM | POA: Diagnosis not present

## 2021-05-26 DIAGNOSIS — M9902 Segmental and somatic dysfunction of thoracic region: Secondary | ICD-10-CM | POA: Diagnosis not present

## 2021-05-26 DIAGNOSIS — M9906 Segmental and somatic dysfunction of lower extremity: Secondary | ICD-10-CM | POA: Diagnosis not present

## 2021-06-03 DIAGNOSIS — M9902 Segmental and somatic dysfunction of thoracic region: Secondary | ICD-10-CM | POA: Diagnosis not present

## 2021-06-03 DIAGNOSIS — M9905 Segmental and somatic dysfunction of pelvic region: Secondary | ICD-10-CM | POA: Diagnosis not present

## 2021-06-03 DIAGNOSIS — M7541 Impingement syndrome of right shoulder: Secondary | ICD-10-CM | POA: Diagnosis not present

## 2021-06-03 DIAGNOSIS — M9907 Segmental and somatic dysfunction of upper extremity: Secondary | ICD-10-CM | POA: Diagnosis not present

## 2021-06-03 DIAGNOSIS — M9903 Segmental and somatic dysfunction of lumbar region: Secondary | ICD-10-CM | POA: Diagnosis not present

## 2021-06-09 DIAGNOSIS — M7541 Impingement syndrome of right shoulder: Secondary | ICD-10-CM | POA: Diagnosis not present

## 2021-06-09 DIAGNOSIS — M9905 Segmental and somatic dysfunction of pelvic region: Secondary | ICD-10-CM | POA: Diagnosis not present

## 2021-06-09 DIAGNOSIS — M9907 Segmental and somatic dysfunction of upper extremity: Secondary | ICD-10-CM | POA: Diagnosis not present

## 2021-06-09 DIAGNOSIS — M9903 Segmental and somatic dysfunction of lumbar region: Secondary | ICD-10-CM | POA: Diagnosis not present

## 2021-06-09 DIAGNOSIS — M9902 Segmental and somatic dysfunction of thoracic region: Secondary | ICD-10-CM | POA: Diagnosis not present

## 2021-06-18 DIAGNOSIS — M9902 Segmental and somatic dysfunction of thoracic region: Secondary | ICD-10-CM | POA: Diagnosis not present

## 2021-06-18 DIAGNOSIS — M9907 Segmental and somatic dysfunction of upper extremity: Secondary | ICD-10-CM | POA: Diagnosis not present

## 2021-06-18 DIAGNOSIS — M9905 Segmental and somatic dysfunction of pelvic region: Secondary | ICD-10-CM | POA: Diagnosis not present

## 2021-06-18 DIAGNOSIS — M7541 Impingement syndrome of right shoulder: Secondary | ICD-10-CM | POA: Diagnosis not present

## 2021-06-18 DIAGNOSIS — M9903 Segmental and somatic dysfunction of lumbar region: Secondary | ICD-10-CM | POA: Diagnosis not present

## 2022-03-02 DIAGNOSIS — R87610 Atypical squamous cells of undetermined significance on cytologic smear of cervix (ASC-US): Secondary | ICD-10-CM | POA: Diagnosis not present

## 2022-03-02 DIAGNOSIS — Z6831 Body mass index (BMI) 31.0-31.9, adult: Secondary | ICD-10-CM | POA: Diagnosis not present

## 2022-03-02 DIAGNOSIS — Z01419 Encounter for gynecological examination (general) (routine) without abnormal findings: Secondary | ICD-10-CM | POA: Diagnosis not present

## 2022-03-02 DIAGNOSIS — E039 Hypothyroidism, unspecified: Secondary | ICD-10-CM | POA: Diagnosis not present

## 2022-03-05 DIAGNOSIS — Z131 Encounter for screening for diabetes mellitus: Secondary | ICD-10-CM | POA: Diagnosis not present

## 2022-03-05 DIAGNOSIS — D649 Anemia, unspecified: Secondary | ICD-10-CM | POA: Diagnosis not present

## 2022-03-05 DIAGNOSIS — Z0000 Encounter for general adult medical examination without abnormal findings: Secondary | ICD-10-CM | POA: Diagnosis not present

## 2022-03-05 DIAGNOSIS — Z13220 Encounter for screening for lipoid disorders: Secondary | ICD-10-CM | POA: Diagnosis not present

## 2022-03-05 DIAGNOSIS — Z1329 Encounter for screening for other suspected endocrine disorder: Secondary | ICD-10-CM | POA: Diagnosis not present

## 2022-05-27 DIAGNOSIS — Z1231 Encounter for screening mammogram for malignant neoplasm of breast: Secondary | ICD-10-CM | POA: Diagnosis not present

## 2022-08-11 DIAGNOSIS — L821 Other seborrheic keratosis: Secondary | ICD-10-CM | POA: Diagnosis not present

## 2022-08-11 DIAGNOSIS — L814 Other melanin hyperpigmentation: Secondary | ICD-10-CM | POA: Diagnosis not present

## 2022-08-11 DIAGNOSIS — D225 Melanocytic nevi of trunk: Secondary | ICD-10-CM | POA: Diagnosis not present

## 2023-02-05 DIAGNOSIS — H6121 Impacted cerumen, right ear: Secondary | ICD-10-CM | POA: Diagnosis not present

## 2023-02-05 DIAGNOSIS — R69 Illness, unspecified: Secondary | ICD-10-CM | POA: Diagnosis not present

## 2023-02-05 DIAGNOSIS — J020 Streptococcal pharyngitis: Secondary | ICD-10-CM | POA: Diagnosis not present

## 2023-02-11 DIAGNOSIS — H6121 Impacted cerumen, right ear: Secondary | ICD-10-CM | POA: Diagnosis not present
